# Patient Record
Sex: Male | Born: 1955 | Race: Black or African American | Hispanic: No | Smoking: Former smoker
Health system: Southern US, Community
[De-identification: ages and names within clinical notes are randomized; demographics above are authoritative.]

## PROBLEM LIST (undated history)

## (undated) DIAGNOSIS — N184 Chronic kidney disease, stage 4 (severe): Secondary | ICD-10-CM

## (undated) DIAGNOSIS — I509 Heart failure, unspecified: Secondary | ICD-10-CM

## (undated) DIAGNOSIS — I10 Essential (primary) hypertension: Secondary | ICD-10-CM

## (undated) DIAGNOSIS — E1129 Type 2 diabetes mellitus with other diabetic kidney complication: Secondary | ICD-10-CM

## (undated) DIAGNOSIS — E7800 Pure hypercholesterolemia, unspecified: Secondary | ICD-10-CM

## (undated) DIAGNOSIS — K219 Gastro-esophageal reflux disease without esophagitis: Secondary | ICD-10-CM

## (undated) HISTORY — PX: WRIST FRACTURE SURGERY: SHX121

## (undated) HISTORY — PX: FRACTURE SURGERY: SHX138

---

## 2018-04-11 DIAGNOSIS — R600 Localized edema: Secondary | ICD-10-CM

## 2018-04-11 DIAGNOSIS — I130 Hypertensive heart and chronic kidney disease with heart failure and stage 1 through stage 4 chronic kidney disease, or unspecified chronic kidney disease: Secondary | ICD-10-CM | POA: Diagnosis not present

## 2018-04-16 ENCOUNTER — Encounter (HOSPITAL_COMMUNITY): Payer: Self-pay

## 2018-04-16 ENCOUNTER — Emergency Department (HOSPITAL_COMMUNITY): Payer: Medicaid Other

## 2018-04-16 ENCOUNTER — Inpatient Hospital Stay (HOSPITAL_COMMUNITY)
Admission: EM | Admit: 2018-04-16 | Discharge: 2018-04-23 | DRG: 291 | Discharge status: Against Medical Advice | Payer: Medicaid Other | Attending: Family Medicine | Admitting: Family Medicine

## 2018-04-16 ENCOUNTER — Other Ambulatory Visit: Payer: Self-pay

## 2018-04-16 DIAGNOSIS — R778 Other specified abnormalities of plasma proteins: Secondary | ICD-10-CM | POA: Diagnosis present

## 2018-04-16 DIAGNOSIS — Z7982 Long term (current) use of aspirin: Secondary | ICD-10-CM

## 2018-04-16 DIAGNOSIS — I160 Hypertensive urgency: Secondary | ICD-10-CM | POA: Diagnosis present

## 2018-04-16 DIAGNOSIS — I5023 Acute on chronic systolic (congestive) heart failure: Secondary | ICD-10-CM | POA: Diagnosis present

## 2018-04-16 DIAGNOSIS — E785 Hyperlipidemia, unspecified: Secondary | ICD-10-CM | POA: Diagnosis present

## 2018-04-16 DIAGNOSIS — R001 Bradycardia, unspecified: Secondary | ICD-10-CM | POA: Diagnosis present

## 2018-04-16 DIAGNOSIS — I248 Other forms of acute ischemic heart disease: Secondary | ICD-10-CM | POA: Diagnosis present

## 2018-04-16 DIAGNOSIS — D696 Thrombocytopenia, unspecified: Secondary | ICD-10-CM | POA: Diagnosis present

## 2018-04-16 DIAGNOSIS — I509 Heart failure, unspecified: Secondary | ICD-10-CM

## 2018-04-16 DIAGNOSIS — R601 Generalized edema: Secondary | ICD-10-CM

## 2018-04-16 DIAGNOSIS — E8770 Fluid overload, unspecified: Secondary | ICD-10-CM | POA: Diagnosis present

## 2018-04-16 DIAGNOSIS — R402412 Glasgow coma scale score 13-15, at arrival to emergency department: Secondary | ICD-10-CM | POA: Diagnosis present

## 2018-04-16 DIAGNOSIS — Z9114 Patient's other noncompliance with medication regimen: Secondary | ICD-10-CM

## 2018-04-16 DIAGNOSIS — Z79899 Other long term (current) drug therapy: Secondary | ICD-10-CM

## 2018-04-16 DIAGNOSIS — K219 Gastro-esophageal reflux disease without esophagitis: Secondary | ICD-10-CM

## 2018-04-16 DIAGNOSIS — I5043 Acute on chronic combined systolic (congestive) and diastolic (congestive) heart failure: Secondary | ICD-10-CM | POA: Diagnosis present

## 2018-04-16 DIAGNOSIS — N184 Chronic kidney disease, stage 4 (severe): Secondary | ICD-10-CM

## 2018-04-16 DIAGNOSIS — N189 Chronic kidney disease, unspecified: Secondary | ICD-10-CM

## 2018-04-16 DIAGNOSIS — E1122 Type 2 diabetes mellitus with diabetic chronic kidney disease: Secondary | ICD-10-CM | POA: Diagnosis present

## 2018-04-16 DIAGNOSIS — D509 Iron deficiency anemia, unspecified: Secondary | ICD-10-CM | POA: Diagnosis present

## 2018-04-16 DIAGNOSIS — I10 Essential (primary) hypertension: Secondary | ICD-10-CM

## 2018-04-16 DIAGNOSIS — D631 Anemia in chronic kidney disease: Secondary | ICD-10-CM | POA: Diagnosis present

## 2018-04-16 DIAGNOSIS — T502X5A Adverse effect of carbonic-anhydrase inhibitors, benzothiadiazides and other diuretics, initial encounter: Secondary | ICD-10-CM | POA: Diagnosis present

## 2018-04-16 DIAGNOSIS — R7989 Other specified abnormal findings of blood chemistry: Secondary | ICD-10-CM

## 2018-04-16 DIAGNOSIS — Z87891 Personal history of nicotine dependence: Secondary | ICD-10-CM

## 2018-04-16 DIAGNOSIS — E7800 Pure hypercholesterolemia, unspecified: Secondary | ICD-10-CM | POA: Diagnosis present

## 2018-04-16 DIAGNOSIS — I132 Hypertensive heart and chronic kidney disease with heart failure and with stage 5 chronic kidney disease, or end stage renal disease: Principal | ICD-10-CM | POA: Diagnosis present

## 2018-04-16 DIAGNOSIS — N179 Acute kidney failure, unspecified: Secondary | ICD-10-CM | POA: Diagnosis present

## 2018-04-16 DIAGNOSIS — Z7984 Long term (current) use of oral hypoglycemic drugs: Secondary | ICD-10-CM

## 2018-04-16 DIAGNOSIS — I272 Pulmonary hypertension, unspecified: Secondary | ICD-10-CM | POA: Diagnosis present

## 2018-04-16 DIAGNOSIS — E1165 Type 2 diabetes mellitus with hyperglycemia: Secondary | ICD-10-CM | POA: Diagnosis present

## 2018-04-16 DIAGNOSIS — E876 Hypokalemia: Secondary | ICD-10-CM | POA: Diagnosis present

## 2018-04-16 DIAGNOSIS — N185 Chronic kidney disease, stage 5: Secondary | ICD-10-CM

## 2018-04-16 DIAGNOSIS — E1129 Type 2 diabetes mellitus with other diabetic kidney complication: Secondary | ICD-10-CM | POA: Diagnosis present

## 2018-04-16 DIAGNOSIS — D649 Anemia, unspecified: Secondary | ICD-10-CM

## 2018-04-16 HISTORY — DX: Type 2 diabetes mellitus with other diabetic kidney complication: E11.29

## 2018-04-16 HISTORY — DX: Gastro-esophageal reflux disease without esophagitis: K21.9

## 2018-04-16 HISTORY — DX: Chronic kidney disease, stage 4 (severe): N18.4

## 2018-04-16 HISTORY — DX: Heart failure, unspecified: I50.9

## 2018-04-16 HISTORY — DX: Pure hypercholesterolemia, unspecified: E78.00

## 2018-04-16 HISTORY — DX: Essential (primary) hypertension: I10

## 2018-04-16 LAB — CBC
HCT: 26.8 % — ABNORMAL LOW (ref 39.0–52.0)
Hemoglobin: 8.3 g/dL — ABNORMAL LOW (ref 13.0–17.0)
MCH: 24.3 pg — AB (ref 26.0–34.0)
MCHC: 31 g/dL (ref 30.0–36.0)
MCV: 78.4 fL (ref 78.0–100.0)
PLATELETS: 129 10*3/uL — AB (ref 150–400)
RBC: 3.42 MIL/uL — AB (ref 4.22–5.81)
RDW: 18.1 % — ABNORMAL HIGH (ref 11.5–15.5)
WBC: 4.2 10*3/uL (ref 4.0–10.5)

## 2018-04-16 LAB — I-STAT TROPONIN, ED: Troponin i, poc: 0.15 ng/mL (ref 0.00–0.08)

## 2018-04-16 LAB — BASIC METABOLIC PANEL
ANION GAP: 12 (ref 5–15)
BUN: 84 mg/dL — AB (ref 8–23)
CO2: 18 mmol/L — AB (ref 22–32)
CREATININE: 4.82 mg/dL — AB (ref 0.61–1.24)
Calcium: 8.6 mg/dL — ABNORMAL LOW (ref 8.9–10.3)
Chloride: 112 mmol/L — ABNORMAL HIGH (ref 98–111)
GFR calc Af Amer: 14 mL/min — ABNORMAL LOW (ref >60)
GFR calc non Af Amer: 12 mL/min — ABNORMAL LOW (ref >60)
GLUCOSE: 140 mg/dL — AB (ref 70–99)
Potassium: 4.1 mmol/L (ref 3.5–5.1)
Sodium: 142 mmol/L (ref 135–145)

## 2018-04-16 NOTE — ED Notes (Signed)
Dr. Rhunette CroftNanavati, Tori Charge RN and Caryn BeeKevin RN notified of elevated Troponin

## 2018-04-16 NOTE — ED Triage Notes (Signed)
Patient c/o leg swelling of bilateral legs and now swelling of groin. Denies CP and SOB.

## 2018-04-17 ENCOUNTER — Encounter (HOSPITAL_COMMUNITY): Payer: Self-pay | Admitting: Internal Medicine

## 2018-04-17 ENCOUNTER — Inpatient Hospital Stay (HOSPITAL_COMMUNITY): Payer: Medicaid Other

## 2018-04-17 ENCOUNTER — Other Ambulatory Visit: Payer: Self-pay

## 2018-04-17 DIAGNOSIS — E1122 Type 2 diabetes mellitus with diabetic chronic kidney disease: Secondary | ICD-10-CM | POA: Diagnosis present

## 2018-04-17 DIAGNOSIS — I132 Hypertensive heart and chronic kidney disease with heart failure and with stage 5 chronic kidney disease, or end stage renal disease: Secondary | ICD-10-CM | POA: Diagnosis present

## 2018-04-17 DIAGNOSIS — D696 Thrombocytopenia, unspecified: Secondary | ICD-10-CM | POA: Diagnosis present

## 2018-04-17 DIAGNOSIS — T502X5A Adverse effect of carbonic-anhydrase inhibitors, benzothiadiazides and other diuretics, initial encounter: Secondary | ICD-10-CM | POA: Diagnosis present

## 2018-04-17 DIAGNOSIS — D631 Anemia in chronic kidney disease: Secondary | ICD-10-CM | POA: Diagnosis present

## 2018-04-17 DIAGNOSIS — Z87891 Personal history of nicotine dependence: Secondary | ICD-10-CM | POA: Diagnosis not present

## 2018-04-17 DIAGNOSIS — E785 Hyperlipidemia, unspecified: Secondary | ICD-10-CM | POA: Diagnosis present

## 2018-04-17 DIAGNOSIS — E7800 Pure hypercholesterolemia, unspecified: Secondary | ICD-10-CM | POA: Diagnosis present

## 2018-04-17 DIAGNOSIS — I5043 Acute on chronic combined systolic (congestive) and diastolic (congestive) heart failure: Secondary | ICD-10-CM | POA: Diagnosis present

## 2018-04-17 DIAGNOSIS — N179 Acute kidney failure, unspecified: Secondary | ICD-10-CM | POA: Diagnosis present

## 2018-04-17 DIAGNOSIS — I340 Nonrheumatic mitral (valve) insufficiency: Secondary | ICD-10-CM | POA: Diagnosis not present

## 2018-04-17 DIAGNOSIS — I5023 Acute on chronic systolic (congestive) heart failure: Secondary | ICD-10-CM | POA: Diagnosis present

## 2018-04-17 DIAGNOSIS — Z79899 Other long term (current) drug therapy: Secondary | ICD-10-CM | POA: Diagnosis not present

## 2018-04-17 DIAGNOSIS — I10 Essential (primary) hypertension: Secondary | ICD-10-CM | POA: Diagnosis present

## 2018-04-17 DIAGNOSIS — E1129 Type 2 diabetes mellitus with other diabetic kidney complication: Secondary | ICD-10-CM | POA: Diagnosis present

## 2018-04-17 DIAGNOSIS — N185 Chronic kidney disease, stage 5: Secondary | ICD-10-CM | POA: Diagnosis present

## 2018-04-17 DIAGNOSIS — I272 Pulmonary hypertension, unspecified: Secondary | ICD-10-CM | POA: Diagnosis present

## 2018-04-17 DIAGNOSIS — I248 Other forms of acute ischemic heart disease: Secondary | ICD-10-CM | POA: Diagnosis present

## 2018-04-17 DIAGNOSIS — D509 Iron deficiency anemia, unspecified: Secondary | ICD-10-CM | POA: Diagnosis present

## 2018-04-17 DIAGNOSIS — I160 Hypertensive urgency: Secondary | ICD-10-CM | POA: Diagnosis present

## 2018-04-17 DIAGNOSIS — R601 Generalized edema: Secondary | ICD-10-CM | POA: Diagnosis present

## 2018-04-17 DIAGNOSIS — E876 Hypokalemia: Secondary | ICD-10-CM | POA: Diagnosis present

## 2018-04-17 DIAGNOSIS — R748 Abnormal levels of other serum enzymes: Secondary | ICD-10-CM | POA: Diagnosis not present

## 2018-04-17 DIAGNOSIS — N184 Chronic kidney disease, stage 4 (severe): Secondary | ICD-10-CM | POA: Diagnosis present

## 2018-04-17 DIAGNOSIS — R778 Other specified abnormalities of plasma proteins: Secondary | ICD-10-CM | POA: Diagnosis present

## 2018-04-17 DIAGNOSIS — Z7984 Long term (current) use of oral hypoglycemic drugs: Secondary | ICD-10-CM | POA: Diagnosis not present

## 2018-04-17 DIAGNOSIS — N189 Chronic kidney disease, unspecified: Secondary | ICD-10-CM

## 2018-04-17 DIAGNOSIS — R001 Bradycardia, unspecified: Secondary | ICD-10-CM | POA: Diagnosis present

## 2018-04-17 DIAGNOSIS — E8770 Fluid overload, unspecified: Secondary | ICD-10-CM | POA: Diagnosis present

## 2018-04-17 DIAGNOSIS — R7989 Other specified abnormal findings of blood chemistry: Secondary | ICD-10-CM | POA: Diagnosis present

## 2018-04-17 DIAGNOSIS — Z7982 Long term (current) use of aspirin: Secondary | ICD-10-CM | POA: Diagnosis not present

## 2018-04-17 DIAGNOSIS — K219 Gastro-esophageal reflux disease without esophagitis: Secondary | ICD-10-CM | POA: Diagnosis present

## 2018-04-17 DIAGNOSIS — Z9114 Patient's other noncompliance with medication regimen: Secondary | ICD-10-CM | POA: Diagnosis not present

## 2018-04-17 DIAGNOSIS — E1165 Type 2 diabetes mellitus with hyperglycemia: Secondary | ICD-10-CM | POA: Diagnosis present

## 2018-04-17 DIAGNOSIS — R402412 Glasgow coma scale score 13-15, at arrival to emergency department: Secondary | ICD-10-CM | POA: Diagnosis present

## 2018-04-17 LAB — MRSA PCR SCREENING: MRSA by PCR: NEGATIVE

## 2018-04-17 LAB — IRON AND TIBC
Iron: 38 ug/dL — ABNORMAL LOW (ref 45–182)
SATURATION RATIOS: 13 % — AB (ref 17.9–39.5)
TIBC: 298 ug/dL (ref 250–450)
UIBC: 260 ug/dL

## 2018-04-17 LAB — TROPONIN I
TROPONIN I: 0.11 ng/mL — AB (ref <0.03)
Troponin I: 0.1 ng/mL (ref <0.03)

## 2018-04-17 LAB — RETICULOCYTES
RBC.: 3.36 MIL/uL — AB (ref 4.22–5.81)
RETIC CT PCT: 0.6 % (ref 0.4–3.1)
Retic Count, Absolute: 20.2 10*3/uL (ref 19.0–186.0)

## 2018-04-17 LAB — HEMOGLOBIN A1C
HEMOGLOBIN A1C: 7.1 % — AB (ref 4.8–5.6)
MEAN PLASMA GLUCOSE: 157.07 mg/dL

## 2018-04-17 LAB — RAPID URINE DRUG SCREEN, HOSP PERFORMED
Amphetamines: NOT DETECTED
BARBITURATES: NOT DETECTED
Benzodiazepines: NOT DETECTED
COCAINE: NOT DETECTED
Opiates: NOT DETECTED
TETRAHYDROCANNABINOL: NOT DETECTED

## 2018-04-17 LAB — LIPID PANEL
CHOL/HDL RATIO: 1.9 ratio
Cholesterol: 179 mg/dL (ref 0–200)
HDL: 93 mg/dL (ref >40)
LDL Cholesterol: 81 mg/dL (ref 0–99)
TRIGLYCERIDES: 23 mg/dL (ref <150)
VLDL: 5 mg/dL (ref 0–40)

## 2018-04-17 LAB — HIV ANTIBODY (ROUTINE TESTING W REFLEX): HIV Screen 4th Generation wRfx: NONREACTIVE

## 2018-04-17 LAB — CBG MONITORING, ED
GLUCOSE-CAPILLARY: 134 mg/dL — AB (ref 70–99)
Glucose-Capillary: 137 mg/dL — ABNORMAL HIGH (ref 70–99)
Glucose-Capillary: 136 mg/dL — ABNORMAL HIGH (ref 70–99)

## 2018-04-17 LAB — ECHOCARDIOGRAM COMPLETE
HEIGHTINCHES: 71 in
WEIGHTICAEL: 3136 [oz_av]

## 2018-04-17 LAB — GLUCOSE, CAPILLARY
Glucose-Capillary: 119 mg/dL — ABNORMAL HIGH (ref 70–99)
Glucose-Capillary: 163 mg/dL — ABNORMAL HIGH (ref 70–99)

## 2018-04-17 LAB — FERRITIN: FERRITIN: 30 ng/mL (ref 24–336)

## 2018-04-17 LAB — VITAMIN B12: VITAMIN B 12: 700 pg/mL (ref 180–914)

## 2018-04-17 LAB — FOLATE: Folate: 13.4 ng/mL (ref >5.9)

## 2018-04-17 LAB — BRAIN NATRIURETIC PEPTIDE: B Natriuretic Peptide: >4500 pg/mL — ABNORMAL HIGH (ref 0.0–100.0)

## 2018-04-17 MED ORDER — FUROSEMIDE 10 MG/ML IJ SOLN
80.0000 mg | Freq: Once | INTRAMUSCULAR | Status: AC
  Administered 2018-04-17: 80 mg via INTRAVENOUS
  Filled 2018-04-17: qty 8

## 2018-04-17 MED ORDER — FUROSEMIDE 10 MG/ML IJ SOLN
40.0000 mg | INTRAMUSCULAR | Status: AC
  Administered 2018-04-17: 40 mg via INTRAVENOUS
  Filled 2018-04-17: qty 4

## 2018-04-17 MED ORDER — MORPHINE SULFATE (PF) 4 MG/ML IV SOLN: 2.0000 mg | INTRAVENOUS | Status: DC | PRN

## 2018-04-17 MED ORDER — CHLORTHALIDONE 25 MG PO TABS
25.0000 mg | ORAL_TABLET | Freq: Every day | ORAL | Status: DC
  Filled 2018-04-17: qty 1

## 2018-04-17 MED ORDER — LISINOPRIL 10 MG PO TABS
10.0000 mg | ORAL_TABLET | Freq: Every day | ORAL | Status: DC
  Administered 2018-04-17: 10 mg via ORAL
  Filled 2018-04-17: qty 1

## 2018-04-17 MED ORDER — ALBUTEROL SULFATE (2.5 MG/3ML) 0.083% IN NEBU
2.5000 mg | INHALATION_SOLUTION | RESPIRATORY_TRACT | Status: DC | PRN
Start: 2018-04-17 — End: 2018-04-23

## 2018-04-17 MED ORDER — CARVEDILOL 12.5 MG PO TABS
12.5000 mg | ORAL_TABLET | Freq: Three times a day (TID) | ORAL | Status: DC
  Administered 2018-04-17 – 2018-04-21 (×16): 12.5 mg via ORAL
  Filled 2018-04-17 (×17): qty 1

## 2018-04-17 MED ORDER — ACETAMINOPHEN 325 MG PO TABS: 650.0000 mg | ORAL_TABLET | ORAL | Status: DC | PRN

## 2018-04-17 MED ORDER — SODIUM CHLORIDE 0.9 % IV SOLN
250.0000 mL | INTRAVENOUS | Status: DC | PRN
  Administered 2018-04-18: 250 mL via INTRAVENOUS

## 2018-04-17 MED ORDER — ONDANSETRON HCL 4 MG/2ML IJ SOLN: 4.0000 mg | Freq: Four times a day (QID) | INTRAMUSCULAR | Status: DC | PRN

## 2018-04-17 MED ORDER — HYDROXYZINE HCL 10 MG PO TABS
10.0000 mg | ORAL_TABLET | Freq: Three times a day (TID) | ORAL | Status: DC | PRN
  Filled 2018-04-17: qty 1

## 2018-04-17 MED ORDER — HYDRALAZINE HCL 20 MG/ML IJ SOLN
5.0000 mg | INTRAMUSCULAR | Status: DC | PRN
  Administered 2018-04-19 – 2018-04-20 (×3): 5 mg via INTRAVENOUS
  Filled 2018-04-17 (×3): qty 1

## 2018-04-17 MED ORDER — HEPARIN SODIUM (PORCINE) 5000 UNIT/ML IJ SOLN
5000.0000 [IU] | Freq: Three times a day (TID) | INTRAMUSCULAR | Status: DC
  Administered 2018-04-17 – 2018-04-21 (×13): 5000 [IU] via SUBCUTANEOUS
  Filled 2018-04-17 (×14): qty 1

## 2018-04-17 MED ORDER — NEPRO/CARBSTEADY PO LIQD: 237.0000 mL | Freq: Two times a day (BID) | ORAL | Status: DC

## 2018-04-17 MED ORDER — SODIUM CHLORIDE 0.9% FLUSH: 3.0000 mL | INTRAVENOUS | Status: DC | PRN

## 2018-04-17 MED ORDER — INSULIN ASPART 100 UNIT/ML ~~LOC~~ SOLN
0.0000 [IU] | Freq: Three times a day (TID) | SUBCUTANEOUS | Status: DC
  Administered 2018-04-17 – 2018-04-18 (×2): 1 [IU] via SUBCUTANEOUS
  Administered 2018-04-18: 3 [IU] via SUBCUTANEOUS
  Administered 2018-04-19: 1 [IU] via SUBCUTANEOUS
  Administered 2018-04-19: 2 [IU] via SUBCUTANEOUS
  Administered 2018-04-20: 1 [IU] via SUBCUTANEOUS
  Administered 2018-04-20 – 2018-04-22 (×4): 2 [IU] via SUBCUTANEOUS
  Filled 2018-04-17: qty 1

## 2018-04-17 MED ORDER — DOXAZOSIN MESYLATE 2 MG PO TABS
2.0000 mg | ORAL_TABLET | Freq: Every day | ORAL | Status: DC
  Administered 2018-04-17 – 2018-04-22 (×7): 2 mg via ORAL
  Filled 2018-04-17 (×8): qty 1

## 2018-04-17 MED ORDER — PANTOPRAZOLE SODIUM 40 MG PO TBEC
40.0000 mg | DELAYED_RELEASE_TABLET | Freq: Every day | ORAL | Status: DC
  Administered 2018-04-17 – 2018-04-22 (×6): 40 mg via ORAL
  Filled 2018-04-17 (×7): qty 1

## 2018-04-17 MED ORDER — NITROGLYCERIN 0.4 MG SL SUBL: 0.4000 mg | SUBLINGUAL_TABLET | SUBLINGUAL | Status: DC | PRN

## 2018-04-17 MED ORDER — NEPRO/CARBSTEADY PO LIQD
237.0000 mL | Freq: Two times a day (BID) | ORAL | Status: DC
  Administered 2018-04-18 (×2): 237 mL via ORAL
  Filled 2018-04-17 (×2): qty 237

## 2018-04-17 MED ORDER — FUROSEMIDE 10 MG/ML IJ SOLN
120.0000 mg | Freq: Two times a day (BID) | INTRAVENOUS | Status: DC
  Administered 2018-04-17 (×2): 120 mg via INTRAVENOUS
  Filled 2018-04-17: qty 10
  Filled 2018-04-17 (×2): qty 12

## 2018-04-17 MED ORDER — PNEUMOCOCCAL VAC POLYVALENT 25 MCG/0.5ML IJ INJ: 0.5000 mL | INJECTION | INTRAMUSCULAR | Status: DC

## 2018-04-17 MED ORDER — HYDRALAZINE HCL 50 MG PO TABS
100.0000 mg | ORAL_TABLET | Freq: Four times a day (QID) | ORAL | Status: DC
  Administered 2018-04-17 (×2): 100 mg via ORAL
  Filled 2018-04-17: qty 4
  Filled 2018-04-17: qty 2
  Filled 2018-04-17: qty 4

## 2018-04-17 MED ORDER — SODIUM CHLORIDE 0.9% FLUSH
3.0000 mL | Freq: Two times a day (BID) | INTRAVENOUS | Status: DC
  Administered 2018-04-17 – 2018-04-22 (×9): 3 mL via INTRAVENOUS

## 2018-04-17 MED ORDER — ASPIRIN 81 MG PO CHEW
81.0000 mg | CHEWABLE_TABLET | Freq: Every day | ORAL | Status: DC
  Administered 2018-04-17 – 2018-04-22 (×6): 81 mg via ORAL
  Filled 2018-04-17 (×7): qty 1

## 2018-04-17 MED ORDER — GLIPIZIDE 10 MG PO TABS: 10.0000 mg | ORAL_TABLET | Freq: Two times a day (BID) | ORAL | Status: DC

## 2018-04-17 MED ORDER — HYDRALAZINE HCL 10 MG PO TABS
10.0000 mg | ORAL_TABLET | Freq: Four times a day (QID) | ORAL | Status: DC
  Administered 2018-04-17: 10 mg via ORAL
  Filled 2018-04-17: qty 1

## 2018-04-17 MED ORDER — INSULIN ASPART 100 UNIT/ML ~~LOC~~ SOLN: 0.0000 [IU] | Freq: Every day | SUBCUTANEOUS | Status: DC

## 2018-04-17 MED ORDER — ZOLPIDEM TARTRATE 5 MG PO TABS: 5.0000 mg | ORAL_TABLET | Freq: Every evening | ORAL | Status: DC | PRN

## 2018-04-17 NOTE — ED Notes (Signed)
Dr. Niu at bedside evaluating pt.  

## 2018-04-17 NOTE — ED Notes (Signed)
MD Dr. Jarvis NewcomerGrunz paged to notify about troponin elevated

## 2018-04-17 NOTE — Progress Notes (Signed)
  Echocardiogram 2D Echocardiogram has been performed.  Celene SkeenVijay  Ridhima Golberg 04/17/2018, 12:18 PM

## 2018-04-17 NOTE — ED Notes (Signed)
ECHO at bedside.

## 2018-04-17 NOTE — ED Provider Notes (Signed)
[facility] EMERGENCY DEPARTMENT Provider Note   CSN: [id number] Arrival date & time: 04/16/18  2134    History   Chief Complaint Chief Complaint  Patient presents with  . Groin Swelling  . Leg Swelling    HPI Brian Carey is a 62 y.o. male.  62 yo male with hx of CKD, CHF (EF 20%), HTN presents to the ED for lower extremity swelling. Patient presenting from jail, where he has received most of his care. Reports worsening swelling x 1 month, more noticeable x 2 weeks. Is most uncomfortable as his swelling has extended to his scrotum. Patient has hx of medication noncompliance, but states he has been taking his medications for the past few weeks. Reports some SOB on exertion, but has not experienced any chest pain. Still makes urine. He has had a 15 pound weight gain recently. No fevers or syncope. Was seen at [facility] ED on 04/11/18 where dialysis was discussed, but patient was not open to this treatment at the time.  The history is provided by the patient. No language interpreter was used.    Past Medical History:  Diagnosis Date  . CKD (chronic kidney disease)   . CKD (chronic kidney disease), stage IV (HCC)   . Diabetes mellitus without complication (HCC)   . Type II diabetes mellitus with renal manifestations [facility])     Patient Active Problem List   Diagnosis Date Noted  . CKD (chronic kidney disease), stage IV (HCC)   . Type II diabetes mellitus with renal manifestations (HCC)   . Essential hypertension     History reviewed. No pertinent surgical history.      Home Medications    Prior to Admission medications   Medication Sig Start Date End Date Taking? Authorizing Provider  aspirin 81 MG chewable tablet Chew 81 mg by mouth daily.   Yes [provider]  bumetanide (BUMEX) 1 MG tablet Take 2 mg by mouth 2 (two) times daily.   Yes [provider]  carvedilol (COREG) 12.5 MG tablet Take 12.5 mg by mouth 3 (three) times daily.    Yes [provider]  chlorthalidone (HYGROTON) 25 MG tablet Take 25 mg by mouth daily.   Yes [provider]  doxazosin (CARDURA) 2 MG tablet Take 2 mg by mouth at bedtime.   Yes [provider]  glipiZIDE (GLUCOTROL) 10 MG tablet Take 10 mg by mouth 2 (two) times daily.   Yes [provider]  hydrALAZINE (APRESOLINE) 50 MG tablet Take 100 mg by mouth 4 (four) times daily.   Yes [provider]  lisinopril (PRINIVIL,ZESTRIL) 10 MG tablet Take 10 mg by mouth daily.   Yes [provider]  pantoprazole (PROTONIX) 40 MG tablet Take 40 mg by mouth daily.   Yes [provider]    Family History History reviewed. No pertinent family history.  Social History Social History   Tobacco Use  . Smoking status: Former Games developer  . Smokeless tobacco: Never Used  Substance Use Topics  . Alcohol use: Not Currently  . Drug use: Not Currently     Allergies   Amlodipine and Penicillins   Review of Systems Review of Systems Ten systems reviewed and are negative for acute change, except as noted in the HPI.    Physical Exam Updated Vital Signs BP (!) 193/98   Pulse 67   Temp 99 F (37.2 C) (Oral)   Resp 14   Ht 5\' 11"  (1.803 m)   Wt  88.9 kg (196 lb)   SpO2 98%   BMI 27.34 kg/m   Physical Exam  Constitutional: He is oriented to person, place, and time. He appears well-developed and well-nourished. No distress.  Nontoxic appearing and in NAD  HENT:  Head: Normocephalic and atraumatic.  Eyes: Conjunctivae and EOM are normal. No scleral icterus.  Neck: Normal range of motion.  Cardiovascular: Normal rate, regular rhythm and intact distal pulses.  Pulmonary/Chest: Effort normal. No stridor. No respiratory distress. He has no wheezes.  Respirations even and unlabored  Abdominal: Soft. He exhibits no distension. There is no tenderness. There is no guarding.  Soft, nontender  Musculoskeletal: Normal range of motion. He exhibits  edema.  3+ pitting edema in BLE extending proximally to the waste. Scrotal edema noted.  Neurological: He is alert and oriented to person, place, and time. He exhibits normal muscle tone. Coordination normal.  GCS 15. Moving all extremities spontaneously.  Skin: Skin is warm and dry. No rash noted. He is not diaphoretic. No erythema. No pallor.  Psychiatric: He has a normal mood and affect. His behavior is normal.  Nursing note and vitals reviewed.    ED Treatments / Results  Labs (all labs ordered are listed, but only abnormal results are displayed) Labs Reviewed  BASIC METABOLIC PANEL - Abnormal; Notable for the following components:      Result Value   Chloride 112 (*)    CO2 18 (*)    Glucose, Bld 140 (*)    BUN 84 (*)    Creatinine, Ser 4.82 (*)    Calcium 8.6 (*)    GFR calc non Af Amer 12 (*)    GFR calc Af Amer 14 (*)    All other components within normal limits  CBC - Abnormal; Notable for the following components:   RBC 3.42 (*)    Hemoglobin 8.3 (*)    HCT 26.8 (*)    MCH 24.3 (*)    RDW 18.1 (*)    Platelets 129 (*)    All other components within normal limits  BRAIN NATRIURETIC PEPTIDE - Abnormal; Notable for the following components:   B Natriuretic Peptide >4,500.0 (*)    All other components within normal limits  I-STAT TROPONIN, ED - Abnormal; Notable for the following components:   Troponin i, poc 0.15 (*)    All other components within normal limits    EKG None  Radiology Dg Chest 2 View  Result Date: 04/16/2018 CLINICAL DATA:  Right-sided chest pain and dyspnea EXAM: CHEST - 2 VIEW COMPARISON:  04/11/2018 FINDINGS: Stable cardiomegaly with moderate aortic atherosclerosis. Clear lungs. No effusion or pneumothorax. No acute osseous abnormality. IMPRESSION: Cardiomegaly without active pulmonary disease. Aortic atherosclerosis without aneurysm. Electronically Signed   By: Tollie Eth M.D.   On: 04/16/2018 22:23    Procedures Procedures (including  critical care time)  Medications Ordered in ED Medications  furosemide (LASIX) injection 40 mg (40 mg Intravenous Given 04/17/18 0118)     Initial Impression / Assessment and Plan / ED Course  I have reviewed the triage vital signs and the nursing notes.  Pertinent labs & imaging results that were available during my care of the patient were reviewed by me and considered in my medical decision making (see chart for details).     62 year old male presents [facility] the emergency department [facility].  He is currently incarcerated.  Complaining of lower extremity swelling as well as scrotal edema with 15 pound weight gain.  He has anasarca on exam extending to his pelvis.  Denies any chest pain, but does report some dyspnea on exertion.  Patient with known history of chronic kidney disease.  He still makes urine.  Kidney disease may be contributing to elevated troponin level, though this may also be influenced by his degree of heart failure with fluid overload.  Clinical picture consistent with CHF exacerbation.  BNP > 4,500.  Patient given initial dose of IV lasix.  Will need further diuresis, requiring admission.  Discussed with patient that dialysis may be in his future.  He verbalizes understanding.  He was initially resistant to plans for starting dialysis, but is more open to them now.    Case discussed with Dr. Clyde LundborgNiu who will assess in the ED for admission.  Patient hemodynamically stable.   Final Clinical Impressions(s) / ED Diagnoses   Final diagnoses:  Acute congestive heart failure, unspecified heart failure type (HCC)  Chronic kidney disease, unspecified CKD stage  Anasarca  Elevated troponin  Anemia, unspecified type    ED Discharge Orders    None       Antony MaduraHumes, Rian Koon, PA-C 04/17/18 0127    Palumbo, April, MD 04/17/18 [phone]

## 2018-04-17 NOTE — H&P (Signed)
History and Physical    Brian Carey DGU:[id number] DOB: April 29, 1956 DOA: 04/16/2018  Referring MD/NP/PA:   PCP: Patient, No Pcp Per   Patient coming from:  The patient is coming from jail.  At baseline, pt is independent for most of ADL.  Chief Complaint: Shortness of breath, worsening leg edema  HPI: Brian Carey is a 62 y.o. male with medical history significant of CKD-4, CHF with EF of 25%, hypertension, hyperlipidemia, diabetes mellitus, GERD, who presents with shortness of breath, worsening leg edema.  Patient is from jail.  Patient states that he developed shortness of breath in the past several days. He has dry cough, no chest pain, fever or chills. His bilateral leg edema has worsened, up to groin area now.  He states that he has nausea and vomited twice yesterday, and had one loose stool bowel movement today.  No abdominal pain.  Patient does not have symptoms of UTI or unilateral weakness.  ED Course: pt was found to have potassium 4.1, bicarbonate 18, creatinine 4.82 (4.3 on 04/11/2018), BUN 84, BMP>4500, troponin 0.07, chest x-ray showed cardiomegaly, temperature normal, elevated blood pressure 209/97, no tachypnea, no tachycardia, oxygen saturation 98% on room air.  Patient is admitted to telemetry bed as inpatient.  Review of Systems:   General: no fevers, chills, no body weight gain, has poor appetite, has fatigue HEENT: no blurry vision, hearing changes or sore throat Respiratory: has dyspnea, coughing, no wheezing CV: no chest pain, no palpitations GI: has nausea, vomiting,  diarrhea, no constipation, abdominal pain, GU: no dysuria, burning on urination, increased urinary frequency, hematuria  Ext: has leg edema Neuro: no unilateral weakness, numbness, or tingling, no vision change or hearing loss Skin: no rash, no skin tear. MSK: No muscle spasm, no deformity, no limitation of range of movement in spin Heme: No easy bruising.  Travel history: No recent long  distant travel.  Allergy:  Allergies  Allergen Reactions  . Amlodipine Other (See Comments)    On MAR  . Penicillins Hives    Past Medical History:  Diagnosis Date  . CKD (chronic kidney disease)   . CKD (chronic kidney disease), stage IV (HCC)   . Diabetes mellitus without complication (HCC)   . Essential hypertension   . GERD (gastroesophageal reflux disease)   . Type II diabetes mellitus with renal manifestations [facility])     Past Surgical History:  Procedure Laterality Date  . right wrist fracture      Social History:  reports that he has quit smoking. He has never used smokeless tobacco. He reports that he drank alcohol. He reports that he has current or past drug history.  Family History:  Family History  Problem Relation Age of Onset  . Stroke Mother   . Brain cancer Father      Prior to Admission medications   Medication Sig Start Date End Date Taking? Authorizing Provider  aspirin 81 MG chewable tablet Chew 81 mg by mouth daily.   Yes [provider]  bumetanide (BUMEX) 1 MG tablet Take 2 mg by mouth 2 (two) times daily.   Yes [provider]  carvedilol (COREG) 12.5 MG tablet Take 12.5 mg by mouth 3 (three) times daily.   Yes [provider]  chlorthalidone (HYGROTON) 25 MG tablet Take 25 mg by mouth daily.   Yes [provider]  doxazosin (CARDURA) 2 MG tablet Take 2 mg by mouth at bedtime.   Yes [provider]  glipiZIDE (GLUCOTROL) 10 MG  tablet Take 10 mg by mouth 2 (two) times daily.   Yes [provider]  hydrALAZINE (APRESOLINE) 50 MG tablet Take 100 mg by mouth 4 (four) times daily.   Yes [provider]  lisinopril (PRINIVIL,ZESTRIL) 10 MG tablet Take 10 mg by mouth daily.   Yes [provider]  pantoprazole (PROTONIX) 40 MG tablet Take 40 mg by mouth daily.   Yes [provider]    Physical Exam: Vitals:   04/17/18 0615 04/17/18 0630 04/17/18 0645 04/17/18 0700  BP: (!)  157/70 (!) 148/68 (!) 145/73 (!) 166/71  Pulse: 64 63 64 65  Resp: 12 13 13 13   Temp:      TempSrc:      SpO2: 98% 98% 97% 98%  Weight:      Height:       General: Not in acute distress. Has anasarca HEENT:       Eyes: PERRL, EOMI, no scleral icterus.       ENT: No discharge from the ears and nose, no pharynx injection, no tonsillar enlargement.        Neck: positive JVD, no bruit, no mass felt. Heme: No neck lymph node enlargement. Cardiac: S1/S2, RRR, 2/6 systolic murmurs, No gallops or rubs. Respiratory:  No rales, wheezing, rhonchi or rubs. GI: Soft, nondistended, nontender, no rebound pain, no organomegaly, BS present. GU: No hematuria Ext: 3+ pitting leg edema bilaterally. 2+DP/PT pulse bilaterally. Musculoskeletal: No joint deformities, No joint redness or warmth, no limitation of ROM in spin. Skin: No rashes.  Neuro: Alert, oriented X3, cranial nerves II-XII grossly intact, moves all extremities normally.  Psych: Patient is not psychotic, no suicidal or hemocidal ideation.  Labs on Admission: I have personally reviewed following labs and imaging studies  CBC: Recent Labs  Lab 04/16/18 2156  WBC 4.2  HGB 8.3*  HCT 26.8*  MCV 78.4  PLT 129*   Basic Metabolic Panel: Recent Labs  Lab 04/16/18 2156  NA 142  K 4.1  CL 112*  CO2 18*  GLUCOSE 140*  BUN 84*  CREATININE 4.82*  CALCIUM 8.6*   GFR: Estimated Creatinine Clearance: 16.9 mL/min (A) (by C-G formula based on SCr of 4.82 mg/dL (H)). Liver Function Tests: No results for input(s): AST, ALT, ALKPHOS, BILITOT, PROT, ALBUMIN in the last 168 hours. No results for input(s): LIPASE, AMYLASE in the last 168 hours. No results for input(s): AMMONIA in the last 168 hours. Coagulation Profile: No results for input(s): INR, PROTIME in the last 168 hours. Cardiac Enzymes: Recent Labs  Lab 04/17/18 0538  TROPONINI 0.11*   BNP (last 3 results) No results for input(s): PROBNP in the last 8760  hours. HbA1C: Recent Labs    04/17/18 0538  HGBA1C 7.1*   CBG: No results for input(s): GLUCAP in the last 168 hours. Lipid Profile: Recent Labs    04/17/18 0538  CHOL 179  HDL 93  LDLCALC 81  TRIG 23  CHOLHDL 1.9   Thyroid Function Tests: No results for input(s): TSH, T4TOTAL, FREET4, T3FREE, THYROIDAB in the last 72 hours. Anemia Panel: No results for input(s): VITAMINB12, FOLATE, FERRITIN, TIBC, IRON, RETICCTPCT in the last 72 hours. Urine analysis: No results found for: COLORURINE, APPEARANCEUR, LABSPEC, PHURINE, GLUCOSEU, HGBUR, BILIRUBINUR, KETONESUR, PROTEINUR, UROBILINOGEN, NITRITE, LEUKOCYTESUR Sepsis Labs: @LABRCNTIP (procalcitonin:4,lacticidven:4) )No results found for this or any previous visit (from the past 240 hour(s)).   Radiological Exams on Admission: Dg Chest 2 View  Result Date: 04/16/2018 CLINICAL DATA:  Right-sided chest pain and dyspnea  EXAM: CHEST - 2 VIEW COMPARISON:  04/11/2018 FINDINGS: Stable cardiomegaly with moderate aortic atherosclerosis. Clear lungs. No effusion or pneumothorax. No acute osseous abnormality. IMPRESSION: Cardiomegaly without active pulmonary disease. Aortic atherosclerosis without aneurysm. Electronically Signed   By: Tollie Ethavid  Kwon M.D.   On: 04/16/2018 22:23     EKG: Independently reviewed.  Sinus rhythm, QTC 509, poor R wave progression, T wave inversion in inferior leads, V5-V6.   Assessment/Plan Principal Problem:   Fluid overload Active Problems:   CKD (chronic kidney disease), stage IV (HCC)   Type II diabetes mellitus with renal manifestations (HCC)   Essential hypertension   GERD (gastroesophageal reflux disease)   Acute on chronic systolic CHF (congestive heart failure) (HCC)   Anasarca   Anemia in chronic kidney disease (CKD)   Thrombocytopenia (HCC)   Elevated troponin   Hypertensive urgency   Fluid overload and acute on chronic systolic CHF: Patient's shortness of breath is likely due to fluid overload  secondary to CHF exacerbation and CKD-4. Pt has EF 25% per document from jail, not has anasarca, consistent with fluid overload and CHF exacerbation.  This is in the setting of CKD- 4  -will admit to tele bed as inpt -start IV lasix 120 mg bid (receieved 40 + 80 mg tonight) -2d echo -fluid restriction -low salt diet  CKD (chronic kidney disease), stage IV (HCC): recent Cre 4.30 on 04/11/18. His Cre 4.82 and BUN, slightly worsened -F/u by BMP -may need to nephrologist, for possiblly starting dialysis at some time point  Type II diabetes mellitus with renal manifestations [facility](HCC): Last A1c not on record. Patient is taking glipizide at home -SSI  Essential hypertension and  Hypertensive urgency: Blood pressure 209/97-->166/71. -started high dose of IV lasix -continue home Cardura, hydralazine, lisinopril, coreg -IV hydralazine as needed  GERD: -Protonix  Anemia in chronic kidney disease (CKD): Hgb 8.3<--9.4 on 04/11/18 -check anemia panel  Thrombocytopenia (HCC): [id number].  Etiology is not clear.  No bleeding tendency. -Follow-up  Elevated troponin: trop 0.17. No CP. Likely due to CKD-IV.  -trop x 3 -A1c and FLP. UDS -f/u 2d echo which is for CHF exacerbation -ASA, Lipitor   DVT ppx: SQ Heparin    Code Status: Full code Family Communication:   Yes, police officer at bedside Disposition Plan:  Anticipate discharge back to previous jail Consults called:  none Admission status:   Inpatient/tele    Date of Service 04/17/2018    Lorretta HarpXilin Zaki Gertsch [facility] Pager [phone]  If 7PM-7AM, please contact night-coverage [email or web address] Password [email or web address] 04/17/2018, 7:15 AM

## 2018-04-17 NOTE — Progress Notes (Signed)
PROGRESS NOTE  Brief Narrative: Brian Carey is a 62 y.o. male with a history of stage IV CKD, chronic combined CHF, HTN, HLD, DM who presented from jail with 1 month of progressive symmetric leg swelling and recently increased exertional dyspnea associated with nonproductive cough. Creatinine 4.82 (putative baseline 4.3) with BUN 84, BNP >4.5k, troponin 0.07 without pulmonary edema on CXR or hypoxia. BP elevated into severe range consistent with hypertensive urgency. He was started on high dose IV lasix and admitted this morning.  Subjective: States he takes medications about 1/2 the time or less. No chest pain, palpitations. Has been making a lot of urine. Didn't sleep last night, so he's tired now and not terribly interested in talking much.  Objective: BP 136/67 (BP Location: Left Arm)   Pulse (!) 58   Temp 97.8 F (36.6 C) (Oral)   Resp 12   Ht 5\' 11"  (1.803 m)   Wt 82.1 kg (181 lb)   SpO2 100%   BMI 25.24 kg/m   Gen: Well-appearing male in no distress, sleeping Pulm: Clear and nonlabored on room air  CV: RRR, II/VI systolic murmur greatest at the base, + JVD, 3+ woody edema symmetrically in lower extremities extending to upper thighs. GI: Soft, NT, ND, +BS  Neuro: Alert and oriented. No focal deficits. Skin: No rashes, lesions or ulcers  Echocardiogram 04/17/2018: - Left ventricle: The cavity size was normal. Wall thickness was   increased in a pattern of mild LVH. Systolic function was   moderately reduced. The estimated ejection fraction was in the   range of 35% to 40%. Diffuse hypokinesis. Doppler parameters are   consistent with a reversible restrictive pattern, indicative of   decreased left ventricular diastolic compliance and/or increased   left atrial pressure (grade 3 diastolic dysfunction). - Ventricular septum: Septal motion showed abnormal function and   dyssynergy. - Aortic valve: Trileaflet; moderately thickened, moderately   calcified leaflets. - Mitral  valve: There was mild regurgitation. - Left atrium: The atrium was moderately dilated. - Right ventricle: The cavity size was mildly dilated. Wall   thickness was normal. - Tricuspid valve: There was moderate regurgitation. - Pulmonary arteries: PA peak pressure: 63 mm Hg (S).  Assessment & Plan: Fluid overload due to acute on chronic combined CHF: Medication nonadherence and advancing kidney disease both contributing - Updated echocardiogram 8/6 demonstrated EF 35%-40%, mild LVH with grade 3 diastolic dysfunction and pulmonary HTN (PASP ).  - Continue lasix 120mg  IV BID and monitor I/O, weights closely. Unknown EDW. Was about 50% compliant with bumex 1mg  BID.  - Continue coreg. Hold lisinopril while diuresing aggressively with advanced CKD.   Stage IV CKD: Has seen a nephrologist who mentioned HD but states he was never told he needed it.  - No urgent indications for dialysis as he's making urine with diuretic dosing, no uremia, hyperkalemia, and moderate acidosis.   T2DM: HbA1c 7.1% - Hold home glipizide, started renal SSI  HTN with hypertensive urgency: Improving.  - Avoid overcorrection. Anticipate improvement with volume status optimization.  - Continue coreg, holding ACE, continue hydralazine. Could consider imdur. Doubt thiazide would be effective with his GFR, so will stop this.   Elevated troponin: With benign downward trend and no chest pain, most consistent with demand ischemia with HTN urgency, CKD. No focal wall motion abnormalities on echo. - Continue ASA, prn NTG. Monitor on telemetry.   Thrombocytopenia: Unknown chronicity.  - Monitor CBC  Anemia of chronic kidney disease: No report of bleeding -  Monitor hgb in AM. Possibly at baseline.   GERD:  - Continue PPI  Tyrone Nineyan B Tanicia Wolaver, MD 04/17/2018, 3:33 PM

## 2018-04-17 NOTE — Progress Notes (Addendum)
Pt refused Hydralazine- reports that it makes him feel lightheaded.  Paged MD

## 2018-04-18 ENCOUNTER — Inpatient Hospital Stay (HOSPITAL_COMMUNITY): Payer: Medicaid Other

## 2018-04-18 DIAGNOSIS — I10 Essential (primary) hypertension: Secondary | ICD-10-CM

## 2018-04-18 DIAGNOSIS — E1129 Type 2 diabetes mellitus with other diabetic kidney complication: Secondary | ICD-10-CM

## 2018-04-18 DIAGNOSIS — K219 Gastro-esophageal reflux disease without esophagitis: Secondary | ICD-10-CM

## 2018-04-18 LAB — BASIC METABOLIC PANEL
Anion gap: 14 (ref 5–15)
BUN: 90 mg/dL — AB (ref 8–23)
CHLORIDE: 109 mmol/L (ref 98–111)
CO2: 20 mmol/L — AB (ref 22–32)
Calcium: 8.6 mg/dL — ABNORMAL LOW (ref 8.9–10.3)
Creatinine, Ser: 5.11 mg/dL — ABNORMAL HIGH (ref 0.61–1.24)
GFR calc Af Amer: 13 mL/min — ABNORMAL LOW (ref >60)
GFR calc non Af Amer: 11 mL/min — ABNORMAL LOW (ref >60)
GLUCOSE: 93 mg/dL (ref 70–99)
Potassium: 3.6 mmol/L (ref 3.5–5.1)
SODIUM: 143 mmol/L (ref 135–145)

## 2018-04-18 LAB — URINALYSIS, ROUTINE W REFLEX MICROSCOPIC
Bacteria, UA: NONE SEEN
Bilirubin Urine: NEGATIVE
GLUCOSE, UA: 50 mg/dL — AB
KETONES UR: NEGATIVE mg/dL
LEUKOCYTES UA: NEGATIVE
Nitrite: NEGATIVE
PH: 5 (ref 5.0–8.0)
PROTEIN: 100 mg/dL — AB
Specific Gravity, Urine: 1.006 (ref 1.005–1.030)

## 2018-04-18 LAB — CBC
HCT: 25.1 % — ABNORMAL LOW (ref 39.0–52.0)
Hemoglobin: 7.8 g/dL — ABNORMAL LOW (ref 13.0–17.0)
MCH: 23.4 pg — AB (ref 26.0–34.0)
MCHC: 31.1 g/dL (ref 30.0–36.0)
MCV: 75.4 fL — AB (ref 78.0–100.0)
PLATELETS: 141 10*3/uL — AB (ref 150–400)
RBC: 3.33 MIL/uL — ABNORMAL LOW (ref 4.22–5.81)
RDW: 18 % — AB (ref 11.5–15.5)
WBC: 3.4 10*3/uL — ABNORMAL LOW (ref 4.0–10.5)

## 2018-04-18 LAB — GLUCOSE, CAPILLARY
Glucose-Capillary: 102 mg/dL — ABNORMAL HIGH (ref 70–99)
Glucose-Capillary: 212 mg/dL — ABNORMAL HIGH (ref 70–99)
Glucose-Capillary: 134 mg/dL — ABNORMAL HIGH (ref 70–99)
Glucose-Capillary: 142 mg/dL — ABNORMAL HIGH (ref 70–99)

## 2018-04-18 LAB — PROTEIN / CREATININE RATIO, URINE
Creatinine, Urine: 18.36 mg/dL
PROTEIN CREATININE RATIO: 4.3 mg/mg{creat} — AB (ref 0.00–0.15)
TOTAL PROTEIN, URINE: 79 mg/dL

## 2018-04-18 LAB — PHOSPHORUS: Phosphorus: 5 mg/dL — ABNORMAL HIGH (ref 2.5–4.6)

## 2018-04-18 MED ORDER — SODIUM CHLORIDE 0.9 % IV SOLN
510.0000 mg | Freq: Once | INTRAVENOUS | Status: AC
  Administered 2018-04-18: 510 mg via INTRAVENOUS
  Filled 2018-04-18: qty 17

## 2018-04-18 MED ORDER — NEPRO/CARBSTEADY PO LIQD
237.0000 mL | ORAL | Status: DC
  Administered 2018-04-19 – 2018-04-21 (×3): 237 mL via ORAL
  Filled 2018-04-18 (×5): qty 237

## 2018-04-18 MED ORDER — HYDRALAZINE HCL 50 MG PO TABS
50.0000 mg | ORAL_TABLET | Freq: Four times a day (QID) | ORAL | Status: DC
  Administered 2018-04-18: 50 mg via ORAL
  Filled 2018-04-18 (×2): qty 1

## 2018-04-18 MED ORDER — FUROSEMIDE 10 MG/ML IJ SOLN
80.0000 mg | Freq: Two times a day (BID) | INTRAMUSCULAR | Status: DC
  Administered 2018-04-18 – 2018-04-20 (×5): 80 mg via INTRAVENOUS
  Filled 2018-04-18 (×5): qty 8

## 2018-04-18 MED ORDER — HYDRALAZINE HCL 25 MG PO TABS
25.0000 mg | ORAL_TABLET | Freq: Four times a day (QID) | ORAL | Status: DC
  Administered 2018-04-18 (×2): 25 mg via ORAL
  Filled 2018-04-18 (×2): qty 1

## 2018-04-18 NOTE — Progress Notes (Signed)
PROGRESS NOTE  Brief Narrative: Brian AnchorsDavid R XXXConley is a 62 y.o. male with a history of stage IV CKD, chronic combined CHF, HTN, HLD, DM who presented from jail with 1 month of progressive symmetric leg swelling and recently increased exertional dyspnea associated with nonproductive cough. Creatinine 4.82 (putative baseline 4.3) with BUN 84, BNP >4.5k, troponin 0.07 without pulmonary edema on CXR or hypoxia. BP elevated into severe range consistent with hypertensive urgency. He was started on high dose IV lasix and admitted with significant diuresis. Creatinine has increased and nephrology consulted for recommendations. Further work up pending.  Subjective: Swelling better. No chest pain, dyspnea is improved.    Objective: BP (!) 174/75 (BP Location: Left Arm)   Pulse 66   Temp 98 F (36.7 C) (Oral)   Resp (!) 2   Ht 5\' 11"  (1.803 m)   Wt 79.4 kg (175 lb)   SpO2 100%   BMI 24.41 kg/m   Gen: 62 y.o. male in no distress Pulm: Nonlabored breathing room air. Clear. CV: Regular rate and rhythm. II/VI systolic murmur, rub, or gallop. No JVD, 2+ dependent edema. GI: Abdomen soft, non-tender, non-distended, with normoactive bowel sounds.  Ext: Warm, no deformities Skin: No rashes, lesions or ulcers on visualized skin.  Neuro: Alert and oriented. No focal neurological deficits. Psych: Judgement and insight appear fair. Mood euthymic & affect congruent. Behavior is appropriate.    Echocardiogram 04/17/2018: - Left ventricle: The cavity size was normal. Wall thickness was   increased in a pattern of mild LVH. Systolic function was   moderately reduced. The estimated ejection fraction was in the   range of 35% to 40%. Diffuse hypokinesis. Doppler parameters are   consistent with a reversible restrictive pattern, indicative of   decreased left ventricular diastolic compliance and/or increased   left atrial pressure (grade 3 diastolic dysfunction). - Ventricular septum: Septal motion showed  abnormal function and   dyssynergy. - Aortic valve: Trileaflet; moderately thickened, moderately   calcified leaflets. - Mitral valve: There was mild regurgitation. - Left atrium: The atrium was moderately dilated. - Right ventricle: The cavity size was mildly dilated. Wall   thickness was normal. - Tricuspid valve: There was moderate regurgitation. - Pulmonary arteries: PA peak pressure: 63 mm Hg (S).  Assessment & Plan: Fluid overload due to acute on chronic combined CHF: Medication nonadherence and advancing kidney disease both contributing - Updated echocardiogram 8/6 demonstrated EF 35%-40%, mild LVH with grade 3 diastolic dysfunction and pulmonary HTN (PASP 63mmHg).  - Continue lasix > with modest bump in Cr and significant diuresis, feel we can lower 120mg  > 80mg  IV BID.  - Continue to monitor I/O, weights closely (down 196 > 181 > 175lbs).  - Continue coreg. Hold lisinopril while diuresing aggressively with advanced CKD.   Stage IV CKD: Has seen a nephrologist who mentioned HD but states he was never told he needed it. Probably due to HTN.  - Allowing for good diuresis. Avoid hypotension. - Appreciate nephrology recommendations.  - Renal U/S, UA and protein pending.  T2DM: HbA1c 7.1% - Hold home glipizide, started renal SSI  HTN with hypertensive urgency: Improving.  - Avoid overcorrection, pt declined hydralazine yesterday because it made him lightheaded. Will titrate hydralazine to 50mg  dose and monitor with goal of normotension over next couple days. Anticipate improvement with volume status optimization.  - Continue coreg, holding ACE, continue hydralazine. Could consider imdur. Doubt thiazide would be effective with his GFR, so this was stopped  Elevated troponin: With  benign downward trend and no chest pain, most consistent with demand ischemia with HTN urgency, CKD. No focal wall motion abnormalities on echo. - Continue ASA, prn NTG. Monitor on telemetry.    Thrombocytopenia: Unknown chronicity.  - Monitor CBC  Anemia of chronic kidney disease and iron deficiency: No report of bleeding - Giving IV iron, will monitor hgb tmrw.  - No active bleeding reported.  GERD:  - Continue PPI  Tyrone Nine, MD 04/18/2018, 4:07 PM

## 2018-04-18 NOTE — Progress Notes (Signed)
Initial Nutrition Assessment  DOCUMENTATION CODES:   Not applicable  INTERVENTION:   -Nepro Shake po daily, each supplement provides 425 kcal and 19 grams protein  NUTRITION DIAGNOSIS:   Increased nutrient needs related to chronic illness(CHF, renal failure) as evidenced by estimated needs.  GOAL:   Patient will meet greater than or equal to 90% of their needs  MONITOR:   PO intake, Supplement acceptance, Diet advancement, Labs, Weight trends, Skin, I & O's  REASON FOR ASSESSMENT:   Malnutrition Screening Tool    ASSESSMENT:   Brian Carey is a 62 y.o. male with medical history significant of CKD-4, CHF with EF of 25%, hypertension, hyperlipidemia, diabetes mellitus, GERD, who presents with shortness of breath, worsening leg edema.  Pt admitted with fluid overload and CHF.   Spoke with pt at bedside, who reports he consumed all of his breakfast this morning. He endorses a poor appetite over an unspecified period of time ("I just haven't felt like eating"). He consumes 3 meals per day- he estimated he only consumes about 40% of meals, but he will usually bring foot items back to his room for later. Pt is unsure as to why his appetite is poor, but states "I guess it's from being in the jail for so long".   Pt reports his UBW is around 180#. He knows he has lost weight from decreased oral intake, but is unsure of amount. No wt hx available to assess changes. Pt denies any changes in activity or stamina.   Discussed importance of good meal intake to promote healing. Pt shares he has not received any nutritional supplements, but us interested in trying to optimize nutritional status.   Last Hgb A1c: 7.1 (04/17/18). PTA DM medications are 10 mg glipizide BID.   Labs reviewed: CBGS: 102-212 (inpatient orders for glycemic control are 0-5 units insulin aspart q HS and 0-9 units insulin aspart TID with meals).    NUTRITION - FOCUSED PHYSICAL EXAM:    Most Recent Value  Orbital  Region  No depletion  Upper Arm Region  No depletion  Thoracic and Lumbar Region  No depletion  Buccal Region  No depletion  Temple Region  No depletion  Clavicle Bone Region  No depletion  Clavicle and Acromion Bone Region  No depletion  Scapular Bone Region  No depletion  Dorsal Hand  No depletion  Patellar Region  No depletion  Anterior Thigh Region  No depletion  Posterior Calf Region  No depletion  Edema (RD Assessment)  Moderate  Hair  Reviewed  Eyes  Reviewed  Mouth  Reviewed  Skin  Reviewed  Nails  Reviewed       Diet Order:   Diet Order           Diet renal with fluid restriction Fluid restriction: 1200 mL Fluid; Room service appropriate? Yes; Fluid consistency: Thin  Diet effective now          EDUCATION NEEDS:   Education needs have been addressed  Skin:  Skin Assessment: Reviewed RN Assessment  Last BM:  04/17/18  Height:   Ht Readings from Last 1 Encounters:  04/17/18 5\' 11"  (1.803 m)    Weight:   Wt Readings from Last 1 Encounters:  04/18/18 175 lb (79.4 kg)    Ideal Body Weight:  78.2 kg  BMI:  Body mass index is 24.41 kg/m.  Estimated Nutritional Needs:   Kcal:  2100-2300  Protein:  95-110 grams  Fluid:  per MD goals  Brian Carey, RD, LDN, CDE Pager: [phone] After hours Pager: [phone]

## 2018-04-18 NOTE — Care Management Note (Signed)
Case Management Note  Patient Details  Name: Brian Carey MRN: [id number] Date of Birth: 1956/01/30  Subjective/Objective:   Fluid Overload               Action/Plan: [facility] independent prior to admission. Guard at bedside, [facility]; He will follow up with the Nurses/ MD at the Facility; CM will continue to follow for progression of care.  Expected Discharge Date:    possibly 04/19/2018              Expected Discharge Plan:  Corrections Facility  Discharge planning Services  CM Consult  Status of Service:  In process, will continue to follow  Reola MosherChandler, Lataysha Vohra L, RN,MHA,BSN [phone] 04/18/2018, 10:25 AM

## 2018-04-18 NOTE — Plan of Care (Signed)

## 2018-04-18 NOTE — Plan of Care (Signed)
°  Problem: Clinical Measurements: °Goal: Ability to maintain clinical measurements within normal limits will improve °Outcome: Progressing °  °Problem: Clinical Measurements: °Goal: Diagnostic test results will improve °Outcome: Progressing °  °Problem: Nutrition: °Goal: Adequate nutrition will be maintained °Outcome: Progressing °  °

## 2018-04-18 NOTE — Consult Note (Signed)
Brian Carey R XXXConley Admit Date: 04/16/2018 04/18/2018 Brian Carey, Brian Carey Requesting Physician:  Jarvis NewcomerGrunz MD  Reason for Consult:  CKD4, Hypervolemia, Edema HPI:  62 year old male with past medical history of CKD 4, chronic systolic heart failure, hypertension, diabetes, hyperlipidemia, GERD who presented yesterday with progressive dyspnea and lower extremity edema.  Patient currently is in jail.  He tells me that [place] but found the visits to be fairly uninformative.  He has very little knowledge about his current kidney disease.  Available notes indicate that his creatinine was 4.3 on 7/31 and upon presentation was 4.8, today it is 5.1.  He had edema into the proximal legs and scrotum.  He was placed on twice daily 80 mg of Lasix with 4.6 L of  urine output yesterday.  Swelling has improved.    Etiology of his renal failure is unclear.  Potentially related to his comorbidities.  No UA, renal ultrasound, UP/C review.  Home medications include bumetanide, carvedilol, chlorthalidone, lisinopril, hydralazine.  He is on a PPI.  He endorses high salt intake.  It does not appear that he takes any NSAIDs.    He has no family history of kidney disease.  2 view chest x-ray presentation with cardiomegaly, no pulmonary edema.  TTE yesterday revealed LVEF of 35 to 40% with diffuse hypokinesis and grade 3 diastolic dysfunction.  Some elevation of right-sided pressures, no severe valvular abnormalities.  Blood pressures were elevated 180s/90s at presentation and have had some improvement to 170s/70s today.  HIV screen negative.   Creatinine, Ser (mg/dL)  Date Value  91/47/829508/03/2018 5.11 (H)  04/16/2018 4.82 (H)  ] I/Os: I/O last 3 completed shifts: In: 359 [P.O.:240; IV Piggyback:119] Out: 5800 [Urine:5800]   ROS NSAIDS: No identified exposure IV Contrast no identified exposure TMP/SMX no identified exposure Hypotension not present Balance of 12 systems is negative w/  exceptions as above  PMH  Past Medical History:  Diagnosis Date  . CHF (congestive heart failure) (HCC)   . CKD (chronic kidney disease), stage IV (HCC)   . Essential hypertension   . GERD (gastroesophageal reflux disease)   . High cholesterol   . Type II diabetes mellitus with renal manifestations (HCC)    PSH  Past Surgical History:  Procedure Laterality Date  . FRACTURE SURGERY    . WRIST FRACTURE SURGERY Right ~ [id number]   FH  Family History  Problem Relation Age of Onset  . Stroke Mother   . Brain cancer Father    SH  reports that he has quit smoking. His smoking use included cigarettes. He has a 112.00 pack-year smoking history. He has never used smokeless tobacco. He reports that he drank alcohol. He reports that he has current or past drug history. Allergies  Allergies  Allergen Reactions  . Amlodipine Other (See Comments)    On MAR  . Penicillins Hives   Home medications Prior to Admission medications   Medication Sig Start Date End Date Taking? Authorizing Provider  aspirin 81 MG chewable tablet Chew 81 mg by mouth daily.   Yes [provider]  bumetanide (BUMEX) 1 MG tablet Take 2 mg by mouth 2 (two) times daily.   Yes [provider]  carvedilol (COREG) 12.5 MG tablet Take 12.5 mg by mouth 3 (three) times daily.   Yes [provider]  chlorthalidone (HYGROTON) 25 MG tablet Take 25 mg by mouth daily.   Yes [provider]  doxazosin (CARDURA) 2 MG tablet Take  2 mg by mouth at bedtime.   Yes [provider]  glipiZIDE (GLUCOTROL) 10 MG tablet Take 10 mg by mouth 2 (two) times daily.   Yes [provider]  hydrALAZINE (APRESOLINE) 50 MG tablet Take 100 mg by mouth 4 (four) times daily.   Yes [provider]  lisinopril (PRINIVIL,ZESTRIL) 10 MG tablet Take 10 mg by mouth daily.   Yes [provider]  pantoprazole (PROTONIX) 40 MG tablet Take 40 mg by mouth daily.   Yes [provider]     Current Medications Scheduled Meds: . aspirin  81 mg Oral Daily  . carvedilol  12.5 mg Oral TID  . doxazosin  2 mg Oral QHS  . feeding supplement (NEPRO CARB STEADY)  237 mL Oral BID BM  . furosemide  80 mg Intravenous BID  . heparin  5,000 Units Subcutaneous Q8H  . hydrALAZINE  25 mg Oral QID  . insulin aspart  0-5 Units Subcutaneous QHS  . insulin aspart  0-9 Units Subcutaneous TID WC  . pantoprazole  40 mg Oral Daily  . sodium chloride flush  3 mL Intravenous Q12H   Continuous Infusions: . sodium chloride     PRN Meds:.sodium chloride, acetaminophen, albuterol, hydrALAZINE, hydrOXYzine, nitroGLYCERIN, sodium chloride flush  CBC Recent Labs  Lab 04/16/18 2156 04/18/18 0503  WBC 4.2 3.4*  HGB 8.3* 7.8*  HCT 26.8* 25.1*  MCV 78.4 75.4*  PLT 129* 141*   Basic Metabolic Panel Recent Labs  Lab 04/16/18 2156 04/18/18 0503  NA 142 143  K 4.1 3.6  CL 112* 109  CO2 18* 20*  GLUCOSE 140* 93  BUN 84* 90*  CREATININE 4.82* 5.11*  CALCIUM 8.6* 8.6*    Physical Exam  Blood pressure (!) 174/75, pulse 66, temperature 98 F (36.7 C), temperature source Oral, resp. rate (!) 2, height 5\' 11"  (1.803 m), weight 79.4 kg (175 lb), SpO2 100 %. GEN: No acute distress, well-appearing, lying flat in bed on room air, breathing comfortably ENT: NCAT EYES: EOMI, anicteric CV: RRR, normal S1 and S2, no rub PULM: Clear throughout ABD: Soft, nontender SKIN: No rashes or lesions EXT: 3+ pitting edema including scrotal edema present, per patient improved   Assessment 40M presenting with significant lower extremity and scrotal edema, acute on chronic systolic heart failure exacerbation, stage V chronic kidney disease of unclear time course.  1. 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 2. Acute on chronic systolic heart failure exacerbation 3. Systemic hypervolemia, no significant pulmonary edema on chest x-ray; lungs  clear 4. Hypertension, not yet controlled, improved 5. Anemia with significant iron deficiency, iron saturation 13% and ferritin 30, hemoglobin 7.8 with microcytosis  Plan 1. UA, UP/C, Renal US, PTH, Phos 2. Cont diuretic dosing, appears effective 3. FOBT, give 510mg  IV Fereheme 4. Daily weights, Daily Renal Panel, Strict I/Os, Avoid nephrotoxins (NSAIDs, judicious IV Contrast) 5. Will continue to follow   Sabra Heck MD [phone] pgr 04/18/2018, 3:04 PM

## 2018-04-19 LAB — BASIC METABOLIC PANEL
ANION GAP: 13 (ref 5–15)
BUN: 91 mg/dL — ABNORMAL HIGH (ref 8–23)
CO2: 23 mmol/L (ref 22–32)
Calcium: 8.7 mg/dL — ABNORMAL LOW (ref 8.9–10.3)
Chloride: 106 mmol/L (ref 98–111)
Creatinine, Ser: 4.72 mg/dL — ABNORMAL HIGH (ref 0.61–1.24)
GFR calc Af Amer: 14 mL/min — ABNORMAL LOW (ref >60)
GFR calc non Af Amer: 12 mL/min — ABNORMAL LOW (ref >60)
GLUCOSE: 74 mg/dL (ref 70–99)
POTASSIUM: 3.4 mmol/L — AB (ref 3.5–5.1)
Sodium: 142 mmol/L (ref 135–145)

## 2018-04-19 LAB — CBC
HEMATOCRIT: 25.2 % — AB (ref 39.0–52.0)
HEMOGLOBIN: 7.9 g/dL — AB (ref 13.0–17.0)
MCH: 23.8 pg — AB (ref 26.0–34.0)
MCHC: 31.3 g/dL (ref 30.0–36.0)
MCV: 75.9 fL — AB (ref 78.0–100.0)
Platelets: 144 10*3/uL — ABNORMAL LOW (ref 150–400)
RBC: 3.32 MIL/uL — ABNORMAL LOW (ref 4.22–5.81)
RDW: 18.2 % — ABNORMAL HIGH (ref 11.5–15.5)
WBC: 3.5 10*3/uL — ABNORMAL LOW (ref 4.0–10.5)

## 2018-04-19 LAB — GLUCOSE, CAPILLARY
GLUCOSE-CAPILLARY: 138 mg/dL — AB (ref 70–99)
Glucose-Capillary: 85 mg/dL (ref 70–99)
Glucose-Capillary: 179 mg/dL — ABNORMAL HIGH (ref 70–99)
Glucose-Capillary: 154 mg/dL — ABNORMAL HIGH (ref 70–99)

## 2018-04-19 MED ORDER — HYDRALAZINE HCL 50 MG PO TABS
100.0000 mg | ORAL_TABLET | Freq: Four times a day (QID) | ORAL | Status: DC
Start: 2018-04-19 — End: 2018-04-19

## 2018-04-19 MED ORDER — HYDRALAZINE HCL 50 MG PO TABS
100.0000 mg | ORAL_TABLET | Freq: Three times a day (TID) | ORAL | Status: DC
  Administered 2018-04-19: 100 mg via ORAL
  Filled 2018-04-19 (×4): qty 2

## 2018-04-19 NOTE — Progress Notes (Signed)
Admit: 04/16/2018 LOS: 2  10M presenting with significant lower extremity and scrotal edema, acute on chronic systolic heart failure exacerbation, stage V chronic kidney disease of unclear time course.  Subjective:  . Continues to respond to diuretics, net -7.3 L with corresponding change in weight . Renal function remains stable at 4.72 serum creatinine, potassium 3.4, bicarbonate 23 . Received Feraheme for microcytic iron deficiency anemia . Urine protein to creatinine ratio 4.3; UA without hemoglobin or hematuria . Renal ultrasound with normal-sized kidneys without obstruction, no structural issues; findings of medical renal disease . Patient states he feels improved; is unaware of any previous proteinuria  08/07 0701 - 08/08 0700 In: 1408.3 [P.O.:1320; I.V.:0.5; IV Piggyback:87.9] Out: 2800 [Urine:2800]  Filed Weights   04/17/18 1433 04/18/18 0734 04/19/18 0530  Weight: 82.1 kg 79.4 kg 75.9 kg    Scheduled Meds: . aspirin  81 mg Oral Daily  . carvedilol  12.5 mg Oral TID  . doxazosin  2 mg Oral QHS  . feeding supplement (NEPRO CARB STEADY)  237 mL Oral Q24H  . furosemide  80 mg Intravenous BID  . heparin  5,000 Units Subcutaneous Q8H  . hydrALAZINE  100 mg Oral Q8H  . insulin aspart  0-5 Units Subcutaneous QHS  . insulin aspart  0-9 Units Subcutaneous TID WC  . pantoprazole  40 mg Oral Daily  . sodium chloride flush  3 mL Intravenous Q12H   Continuous Infusions: . sodium chloride Stopped (04/18/18 1831)   PRN Meds:.sodium chloride, acetaminophen, albuterol, hydrALAZINE, hydrOXYzine, nitroGLYCERIN, sodium chloride flush  Current Labs: reviewed  Results for Brian Carey, Brian Carey (MRN [id number]) as of 04/19/2018 11:44  Ref. Range 04/18/2018 15:54  Protein Creatinine Ratio Latest Ref Range: 0.00 - 0.15 mg/mgCre 4.30 (H)    Ref. Range 04/17/2018 05:38  Hemoglobin A1C Latest Ref Range: 4.8 - 5.6 % 7.1 (H)   Physical Exam:  Blood pressure (!) 159/70, pulse 63, temperature 98.9 F  (37.2 C), temperature source Oral, resp. rate 18, height 5\' 11"  (1.803 m), weight 75.9 kg, SpO2 98 %. GEN: No acute distress, well-appearing, lying flat in bed on room air, breathing comfortably ENT: NCAT EYES: EOMI, anicteric CV: RRR, normal S1 and S2, no rub PULM: Clear throughout ABD: Soft, nontender SKIN: No rashes or lesions EXT: improved now 1-2+ pitting edema   A 1. 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 2. Nephrotic proteinuria, new finding; contributing to hypervolemic state; unclear etiology 3. Acute on chronic systolic heart failure exacerbation 4. Systemic hypervolemia, no significant pulmonary edema on chest x-ray; lungs clear 5. Hypertension, not yet controlled, improved 6. Anemia with significant iron deficiency, iron saturation 13% and ferritin 30, hemoglobin 7.8 with microcytosis  P . Continue diuretics at current dosing . Send serological work-up for nephrotic proteinuria: Hemoglobin A1c, ANA, C3, C4, HBV surface antigen, HCV antibody, SPEP, serum free light chains; HIV screen negative already . Daily weights, Daily Renal Panel, Strict I/Os, Avoid nephrotoxins (NSAIDs, judicious IV Contrast)   Sabra Heckyan Darcus Edds MD 04/19/2018, 11:43 AM  Recent Labs  Lab 04/16/18 2156 04/18/18 0503 04/18/18 1523 04/19/18 0432  NA 142 143  --  142  K 4.1 3.6  --  3.4*  CL 112* 109  --  106  CO2 18* 20*  --  23  GLUCOSE 140* 93  --  74  BUN 84* 90*  --  91*  CREATININE 4.82* 5.11*  --  4.72*  CALCIUM 8.6* 8.6*  --  8.7*  PHOS  --   --  5.0*  --    Recent Labs  Lab 04/16/18 2156 04/18/18 0503 04/19/18 0432  WBC 4.2 3.4* 3.5*  HGB 8.3* 7.8* 7.9*  HCT 26.8* 25.1* 25.2*  MCV 78.4 75.4* 75.9*  PLT 129* 141* 144*

## 2018-04-19 NOTE — Progress Notes (Addendum)
PROGRESS NOTE  Brief Narrative: Brian AnchorsDavid R XXXConley is a 62 y.o. male with a history of stage IV CKD, chronic combined CHF, HTN, HLD, DM who presented from jail with 1 month of progressive symmetric leg swelling and recently increased exertional dyspnea associated with nonproductive cough. Creatinine 4.82 (putative baseline 4.3) with BUN 84, BNP >4.5k, troponin 0.07 without pulmonary edema on CXR or hypoxia. BP elevated into severe range consistent with hypertensive urgency. He was started on high dose IV lasix and admitted with significant diuresis. Creatinine has increased and nephrology consulted for recommendations. Further work up pending.  Subjective: Breathing back to baseline. No chest pain or palpitations. Eating ok. Swelling in legs continues gradual improvement.  Objective: BP (!) 159/70   Pulse 63   Temp 98.9 F (37.2 C) (Oral)   Resp 18   Ht 5\' 11"  (1.803 m)   Wt 75.9 kg   SpO2 98%   BMI 23.35 kg/m   Gen: 62 y.o. male in no distress Pulm: Nonlabored breathing room air. Clear. CV: Regular rate and rhythm. II/VI systolic murmur, rub, or gallop. No JVD, 1+ dependent edema. GI: Abdomen soft, non-tender, non-distended, with normoactive bowel sounds.  Ext: Warm, no deformities Skin: No rashes, lesions or ulcers on visualized skin.  Neuro: Alert and oriented. No focal neurological deficits. Psych: Judgement and insight appear fair. Mood euthymic & affect congruent. Behavior is appropriate.    Echocardiogram 04/17/2018: - Left ventricle: The cavity size was normal. Wall thickness was   increased in a pattern of mild LVH. Systolic function was   moderately reduced. The estimated ejection fraction was in the   range of 35% to 40%. Diffuse hypokinesis. Doppler parameters are   consistent with a reversible restrictive pattern, indicative of   decreased left ventricular diastolic compliance and/or increased   left atrial pressure (grade 3 diastolic dysfunction). - Ventricular septum:  Septal motion showed abnormal function and   dyssynergy. - Aortic valve: Trileaflet; moderately thickened, moderately   calcified leaflets. - Mitral valve: There was mild regurgitation. - Left atrium: The atrium was moderately dilated. - Right ventricle: The cavity size was mildly dilated. Wall   thickness was normal. - Tricuspid valve: There was moderate regurgitation. - Pulmonary arteries: PA peak pressure: 63 mm Hg (S).  Assessment & Plan: Fluid overload due to acute on chronic combined CHF: Medication nonadherence and advancing kidney disease both contributing - Updated echocardiogram 8/6 demonstrated EF 35%-40%, mild LVH with grade 3 diastolic dysfunction and pulmonary HTN (PASP 63mmHg).  - Continue lasix 80mg  IV BID, still significant diuresis with this.  - Continue to monitor I/O, weights closely (down 196 > 181 > 175 > 167lbs).  - Continue coreg. Holding lisinopril while diuresing aggressively with advanced CKD.  Stage IV CKD: Has seen a nephrologist who mentioned HD but states he was never told he needed it. Probably due to HTN. U/S with increased echogenicity, 9.9cm bilaterally with no hydro. Significant proteinuria noted.  - Stable/modestly improved with diuresis. Avoid hypotension. - Appreciate nephrology recommendations.   T2DM: HbA1c 7.1%. At inpatient goal with minimal insulin requirements.  - Hold home glipizide, started renal SSI  HTN with hypertensive urgency: Improving.  - Now able to continue toward goal of normotension, will increase hydralazine and continue other medications below.  - Continue coreg, holding ACE, continue hydralazine. Could consider imdur. Doubt thiazide would be effective with his GFR, so this was stopped  Hypokalemia: Mild, due to diuresis.  - Defer to nephrology  Elevated troponin: With benign downward  trend and no chest pain, most consistent with demand ischemia with HTN urgency, CKD. No focal wall motion abnormalities on echo. - Continue  ASA, prn NTG. Monitor on telemetry.   Thrombocytopenia: Unknown chronicity, mild.  - Monitor CBC  Anemia of chronic kidney disease and iron deficiency: No report of bleeding - Gave IV iron.  - Hgb stable  GERD:  - Continue PPI  Tyrone Nine, MD 04/19/2018, 10:03 AM

## 2018-04-20 LAB — GLUCOSE, CAPILLARY
Glucose-Capillary: 98 mg/dL (ref 70–99)
Glucose-Capillary: 150 mg/dL — ABNORMAL HIGH (ref 70–99)
Glucose-Capillary: 152 mg/dL — ABNORMAL HIGH (ref 70–99)
Glucose-Capillary: 178 mg/dL — ABNORMAL HIGH (ref 70–99)

## 2018-04-20 LAB — CBC
HCT: 25.6 % — ABNORMAL LOW (ref 39.0–52.0)
Hemoglobin: 8.1 g/dL — ABNORMAL LOW (ref 13.0–17.0)
MCH: 23.8 pg — ABNORMAL LOW (ref 26.0–34.0)
MCHC: 31.6 g/dL (ref 30.0–36.0)
MCV: 75.1 fL — AB (ref 78.0–100.0)
PLATELETS: 152 10*3/uL (ref 150–400)
RBC: 3.41 MIL/uL — ABNORMAL LOW (ref 4.22–5.81)
RDW: 18 % — AB (ref 11.5–15.5)
WBC: 3.5 10*3/uL — ABNORMAL LOW (ref 4.0–10.5)

## 2018-04-20 LAB — BASIC METABOLIC PANEL
Anion gap: 14 (ref 5–15)
BUN: 93 mg/dL — AB (ref 8–23)
CHLORIDE: 105 mmol/L (ref 98–111)
CO2: 24 mmol/L (ref 22–32)
CREATININE: 5.48 mg/dL — AB (ref 0.61–1.24)
Calcium: 8.9 mg/dL (ref 8.9–10.3)
GFR calc Af Amer: 12 mL/min — ABNORMAL LOW (ref >60)
GFR calc non Af Amer: 10 mL/min — ABNORMAL LOW (ref >60)
Glucose, Bld: 94 mg/dL (ref 70–99)
Potassium: 3.5 mmol/L (ref 3.5–5.1)
SODIUM: 143 mmol/L (ref 135–145)

## 2018-04-20 LAB — C4 COMPLEMENT: Complement C4, Body Fluid: 29 mg/dL (ref 14–44)

## 2018-04-20 LAB — HCV AB W REFLEX TO QUANT PCR

## 2018-04-20 LAB — KAPPA/LAMBDA LIGHT CHAINS
KAPPA FREE LGHT CHN: 121.7 mg/L — AB (ref 3.3–19.4)
Kappa, lambda light chain ratio: 1.35 (ref 0.26–1.65)
Lambda free light chains: 90 mg/L — ABNORMAL HIGH (ref 5.7–26.3)

## 2018-04-20 LAB — HCV INTERPRETATION

## 2018-04-20 LAB — C3 COMPLEMENT: C3 COMPLEMENT: 123 mg/dL (ref 82–167)

## 2018-04-20 LAB — ANA W/REFLEX IF POSITIVE: ANA: NEGATIVE

## 2018-04-20 LAB — HEPATITIS B SURFACE ANTIGEN: Hepatitis B Surface Ag: NEGATIVE

## 2018-04-20 MED ORDER — AMLODIPINE BESYLATE 5 MG PO TABS
5.0000 mg | ORAL_TABLET | Freq: Every day | ORAL | Status: DC
  Administered 2018-04-21 – 2018-04-22 (×2): 5 mg via ORAL
  Filled 2018-04-20 (×4): qty 1

## 2018-04-20 MED ORDER — FUROSEMIDE 80 MG PO TABS
80.0000 mg | ORAL_TABLET | Freq: Two times a day (BID) | ORAL | Status: DC
  Administered 2018-04-20 – 2018-04-21 (×2): 80 mg via ORAL
  Filled 2018-04-20 (×2): qty 1

## 2018-04-20 MED ORDER — LABETALOL HCL 5 MG/ML IV SOLN
5.0000 mg | Freq: Once | INTRAVENOUS | Status: DC
  Filled 2018-04-20: qty 4

## 2018-04-20 MED ORDER — HYDRALAZINE HCL 50 MG PO TABS
50.0000 mg | ORAL_TABLET | Freq: Three times a day (TID) | ORAL | Status: DC
  Administered 2018-04-21: 50 mg via ORAL
  Filled 2018-04-20 (×3): qty 1

## 2018-04-20 NOTE — Progress Notes (Signed)
PROGRESS NOTE  Brief Narrative: Brian Carey is a 62 y.o. male with a history of stage IV CKD, chronic combined CHF, HTN, HLD, DM who presented from jail with 1 month of progressive symmetric leg swelling and recently increased exertional dyspnea associated with nonproductive cough. Creatinine 4.82 (putative baseline 4.3) with BUN 84, BNP >4.5k, troponin 0.07 without pulmonary edema on CXR or hypoxia. BP elevated into severe range consistent with hypertensive urgency. He was started on high dose IV lasix and admitted with significant diuresis. Creatinine has increased and nephrology consulted for recommendations. Further work up pending.  Subjective: Continues to refuse hydralazine because it makes him dizzy, but was taking lower doses when ordered. Has had same problem with norvasc, though is willing to take a low dose of this. Leg swelling improved, still not quite at baseline.  Objective: BP (!) 175/76 (BP Location: Right Arm)   Pulse (!) 57   Temp 98.6 F (37 C) (Oral)   Resp 18   Ht 5\' 11"  (1.803 m)   Wt 72.6 kg   SpO2 99%   BMI 22.32 kg/m   Gen: 62 y.o. male in no distress Pulm: Nonlabored breathing room air. Clear. CV: Regular rate and rhythm. +Systolic murmur, no rub, or gallop. No JVD, 1+ dependent edema. GI: Abdomen soft, non-tender, non-distended, with normoactive bowel sounds.  Ext: Warm, no deformities Skin: No rashes, lesions or ulcers on visualized skin.  Neuro: Alert and oriented. No focal neurological deficits. Psych: Judgement and insight appear mildly impaired. Mood euthymic & affect congruent. Behavior is appropriate.    Echocardiogram 04/17/2018: - Left ventricle: The cavity size was normal. Wall thickness was   increased in a pattern of mild LVH. Systolic function was   moderately reduced. The estimated ejection fraction was in the   range of 35% to 40%. Diffuse hypokinesis. Doppler parameters are   consistent with a reversible restrictive pattern, indicative  of   decreased left ventricular diastolic compliance and/or increased   left atrial pressure (grade 3 diastolic dysfunction). - Ventricular septum: Septal motion showed abnormal function and   dyssynergy. - Aortic valve: Trileaflet; moderately thickened, moderately   calcified leaflets. - Mitral valve: There was mild regurgitation. - Left atrium: The atrium was moderately dilated. - Right ventricle: The cavity size was mildly dilated. Wall   thickness was normal. - Tricuspid valve: There was moderate regurgitation. - Pulmonary arteries: PA peak pressure: 63 mm Hg (S).  Assessment & Plan: Fluid overload due to acute on chronic combined CHF: Medication nonadherence and advancing kidney disease both contributing. Echo 8/6 demonstrated EF 35%-40%, mild LVH with grade 3 diastolic dysfunction and pulmonary HTN (PASP ).  - Continue lasix, convert to PO and monitor response. - Continue to monitor I/O, weights closely (continue significant downward trend) - Continue coreg. Holding lisinopril while diuresing aggressively with advanced CKD.  Stage IV CKD, nephrotic proteinuria: Has seen a nephrologist who mentioned HD but states he was never told he needed it. Probably due to HTN. U/S with increased echogenicity, 9.9cm bilaterally with no hydro. Significant proteinuria noted.  - Complement, ANA, Hep B and C, HIV all neg/nonreactive.  - 24 hrs protein collection started - Appreciate nephrology recommendations. Will need nephrology follow up and consideration of access placement soon. No current urgent HD needs.  HTN with hypertensive urgency: Improving.  - Continue lowq dose hydralazine as pt refuses higher doses - Start norvasc 5mg  as pt agrees to try this (has dizziness in the past with this) - Continue coreg,  holding ACE. Could consider imdur. Doubt thiazide would be effective with his GFR, so this was stopped  T2DM: HbA1c 7.1%. At inpatient goal with minimal insulin requirements.  - Hold  home glipizide, started renal SSI  Hypokalemia: Mild, due to diuresis. Resolved. - Defer to nephrology  Elevated troponin: With benign downward trend and no chest pain, most consistent with demand ischemia with HTN urgency, CKD. No focal wall motion abnormalities on echo. - Continue ASA, prn NTG. Monitor on telemetry.   Thrombocytopenia: Unknown chronicity, mild.  - Monitor CBC  Anemia of chronic kidney disease and iron deficiency: No report of bleeding - Gave IV iron.  - Hgb stable, monitor intermittently.  GERD:  - Continue PPI  Brian Nineyan B Kasiah Manka, MD 04/20/2018, 3:23 PM

## 2018-04-20 NOTE — Progress Notes (Signed)
Pt refused Hydralazine - reports dizziness after taking the medicine

## 2018-04-20 NOTE — Progress Notes (Signed)
S: No new complaints.  Refused hydralazine O:BP (!) 175/76 (BP Location: Right Arm)   Pulse (!) 57   Temp 98.6 F (37 C) (Oral)   Resp 18   Ht 5\' 11"  (1.803 m)   Wt 72.6 kg   SpO2 99%   BMI 22.32 kg/m   Intake/Output Summary (Last 24 hours) at 04/20/2018 1456 Last data filed at 04/20/2018 1338 Gross per 24 hour  Intake 520 ml  Output 5400 ml  Net -4880 ml   Intake/Output: I/O last 3 completed shifts: In: 1080 [P.O.:1080] Out: 5875 [Urine:5875]  Intake/Output this shift:  Total I/O In: 280 [P.O.:280] Out: 1475 [Urine:1475] Weight change: -6.804 kg Gen: NAD CVS: no rub Resp: cta Abd: benign Ext: 1+ edema  Recent Labs  Lab 04/16/18 2156 04/18/18 0503 04/18/18 1523 04/19/18 0432 04/20/18 0551  NA 142 143  --  142 143  K 4.1 3.6  --  3.4* 3.5  CL 112* 109  --  106 105  CO2 18* 20*  --  23 24  GLUCOSE 140* 93  --  74 94  BUN 84* 90*  --  91* 93*  CREATININE 4.82* 5.11*  --  4.72* 5.48*  CALCIUM 8.6* 8.6*  --  8.7* 8.9  PHOS  --   --  5.0*  --   --    Liver Function Tests: No results for input(s): AST, ALT, ALKPHOS, BILITOT, PROT, ALBUMIN in the last 168 hours. No results for input(s): LIPASE, AMYLASE in the last 168 hours. No results for input(s): AMMONIA in the last 168 hours. CBC: Recent Labs  Lab 04/16/18 2156 04/18/18 0503 04/19/18 0432 04/20/18 0551  WBC 4.2 3.4* 3.5* 3.5*  HGB 8.3* 7.8* 7.9* 8.1*  HCT 26.8* 25.1* 25.2* 25.6*  MCV 78.4 75.4* 75.9* 75.1*  PLT 129* 141* 144* 152   Cardiac Enzymes: Recent Labs  Lab 04/17/18 0538 04/17/18 1003  TROPONINI 0.11* 0.10*   CBG: Recent Labs  Lab 04/19/18 1112 04/19/18 1620 04/19/18 2140 04/20/18 0734 04/20/18 1139  GLUCAP 179* 138* 154* 98 150*    Iron Studies: No results for input(s): IRON, TIBC, TRANSFERRIN, FERRITIN in the last 72 hours. Studies/Results: US Renal  Result Date: 04/18/2018 CLINICAL DATA:  Chronic kidney disease.  Diabetes. EXAM: RENAL / URINARY TRACT ULTRASOUND COMPLETE  COMPARISON:  None. FINDINGS: Right Kidney: Length: 9.9 cm.  Diffusely echogenic.  No mass or hydronephrosis. Left Kidney: Length: 9.9 cm. Diffusely echogenic. 1.6 cm simple appearing upper pole cyst. No mass or hydronephrosis. Bladder: Appears normal for degree of bladder distention. Other: Enlarged prostate gland.  Bilateral pleural effusions. IMPRESSION: 1. Diffusely echogenic kidneys compatible with medical renal disease. 2. Bilateral pleural effusions. 3. Moderate prostatic hypertrophy. Electronically Signed   By: Beckie Salts M.D.   On: 04/18/2018 18:16   . aspirin  81 mg Oral Daily  . carvedilol  12.5 mg Oral TID  . doxazosin  2 mg Oral QHS  . feeding supplement (NEPRO CARB STEADY)  237 mL Oral Q24H  . furosemide  80 mg Intravenous BID  . heparin  5,000 Units Subcutaneous Q8H  . hydrALAZINE  100 mg Oral Q8H  . insulin aspart  0-5 Units Subcutaneous QHS  . insulin aspart  0-9 Units Subcutaneous TID WC  . labetalol  5 mg Intravenous Once  . pantoprazole  40 mg Oral Daily  . sodium chloride flush  3 mL Intravenous Q12H    BMET    Component Value Date/Time   NA 143 04/20/2018 0551  K 3.5 04/20/2018 0551   CL 105 04/20/2018 0551   CO2 24 04/20/2018 0551   GLUCOSE 94 04/20/2018 0551   BUN 93 (H) 04/20/2018 0551   CREATININE 5.48 (H) 04/20/2018 0551   CALCIUM 8.9 04/20/2018 0551   GFRNONAA 10 (L) 04/20/2018 0551   GFRAA 12 (L) 04/20/2018 0551   CBC    Component Value Date/Time   WBC 3.5 (L) 04/20/2018 0551   RBC 3.41 (L) 04/20/2018 0551   HGB 8.1 (L) 04/20/2018 0551   HCT 25.6 (L) 04/20/2018 0551   PLT 152 04/20/2018 0551   MCV 75.1 (L) 04/20/2018 0551   MCH 23.8 (L) 04/20/2018 0551   MCHC 31.6 04/20/2018 0551   RDW 18.0 (H) 04/20/2018 0551     Assessment/Plan:  1. AKI vs progressive CKD- no known baseline creatinine and pt denies any history of CKD, however renal US revealed echogenic kidneys.  Agree with Dr. Marisue HumbleSanford that he likely has chronic, advanced CKD from HTN  +/- GN such as FSGS.  Serologies pending.  No indication for HD at this time, as pt is without uremic symptoms and has responded well to diuresis.  Will need education on HD and vascular access placement in the near future. 2. Acute on chronic systolic CHF- diuresing well.  Will change to po lasix and follow his response.  3. Proteinuria- work up underway but need albumin and lipid panel to r/o nephrotic syndrome.  Also need to quantify urine protein 4. HTN- not well controlled and pt refused hydralazine today due to "makes me dizzy".  Cant use ace/arb due to advanced CKD.  Cant increase coreg due to bradycardia.  Will try lower dose of hydralazine and follow 5. Anemia of CKD- low iron stores, repleted but will likely require ESA 6. Disposition- pt currently incarcerated but will need to follow up with our office after discharge.   Irena CordsJoseph A. Katiria Calame, MD [facility] [phone]

## 2018-04-21 LAB — RENAL FUNCTION PANEL
ANION GAP: 11 (ref 5–15)
Albumin: 2.5 g/dL — ABNORMAL LOW (ref 3.5–5.0)
BUN: 99 mg/dL — ABNORMAL HIGH (ref 8–23)
CALCIUM: 8.6 mg/dL — AB (ref 8.9–10.3)
CO2: 26 mmol/L (ref 22–32)
Chloride: 104 mmol/L (ref 98–111)
Creatinine, Ser: 5.51 mg/dL — ABNORMAL HIGH (ref 0.61–1.24)
GFR calc Af Amer: 12 mL/min — ABNORMAL LOW (ref >60)
GFR calc non Af Amer: 10 mL/min — ABNORMAL LOW (ref >60)
Glucose, Bld: 104 mg/dL — ABNORMAL HIGH (ref 70–99)
POTASSIUM: 3.4 mmol/L — AB (ref 3.5–5.1)
Phosphorus: 4.6 mg/dL (ref 2.5–4.6)
SODIUM: 141 mmol/L (ref 135–145)

## 2018-04-21 LAB — GLUCOSE, CAPILLARY
GLUCOSE-CAPILLARY: 166 mg/dL — AB (ref 70–99)
Glucose-Capillary: 105 mg/dL — ABNORMAL HIGH (ref 70–99)
Glucose-Capillary: 178 mg/dL — ABNORMAL HIGH (ref 70–99)
Glucose-Capillary: 118 mg/dL — ABNORMAL HIGH (ref 70–99)

## 2018-04-21 LAB — PROTEIN ELECTROPHORESIS, SERUM
A/G Ratio: 0.8 (ref 0.7–1.7)
ALBUMIN ELP: 2.4 g/dL — AB (ref 2.9–4.4)
ALPHA-1-GLOBULIN: 0.3 g/dL (ref 0.0–0.4)
Alpha-2-Globulin: 0.8 g/dL (ref 0.4–1.0)
Beta Globulin: 0.8 g/dL (ref 0.7–1.3)
GLOBULIN, TOTAL: 2.9 g/dL (ref 2.2–3.9)
Gamma Globulin: 0.9 g/dL (ref 0.4–1.8)
Total Protein ELP: 5.3 g/dL — ABNORMAL LOW (ref 6.0–8.5)

## 2018-04-21 LAB — PROTEIN, URINE, 24 HOUR
Collection Interval-UPROT: 24 hours
Protein, 24H Urine: 4270 mg/d — ABNORMAL HIGH (ref 50–100)
Protein, Urine: 122 mg/dL
Urine Total Volume-UPROT: 3500 mL

## 2018-04-21 MED ORDER — FUROSEMIDE 80 MG PO TABS
80.0000 mg | ORAL_TABLET | Freq: Every day | ORAL | Status: DC
  Administered 2018-04-22: 80 mg via ORAL
  Filled 2018-04-21 (×2): qty 1

## 2018-04-21 NOTE — Progress Notes (Signed)
Patient resting comfortably during shift report. Denies complaints. Officer Jarrell at bedside .

## 2018-04-21 NOTE — Progress Notes (Signed)
S: Anxious to leave.  Able to tolerate lower dose of hydralazine O:BP (!) 160/71 (BP Location: Right Arm)   Pulse 63   Temp 98.3 F (36.8 C) (Oral)   Resp 18   Ht 5\' 11"  (1.803 m)   Wt 69.9 kg   SpO2 100%   BMI 21.49 kg/m   Intake/Output Summary (Last 24 hours) at 04/21/2018 1132 Last data filed at 04/21/2018 1110 Gross per 24 hour  Intake 521.17 ml  Output 3525 ml  Net -3003.83 ml   Intake/Output: I/O last 3 completed shifts: In: 520 [P.O.:520] Out: 6975 [Urine:6975]  Intake/Output this shift:  Total I/O In: 241.2 [P.O.:240; I.V.:1.2] Out: 325 [Urine:325] Weight change: -2.676 kg Gen: NAD CVS: no rub Resp: cta Abd: benign Ext: trace edema  Recent Labs  Lab 04/16/18 2156 04/18/18 0503 04/18/18 1523 04/19/18 0432 04/20/18 0551 04/21/18 0420  NA 142 143  --  142 143 141  K 4.1 3.6  --  3.4* 3.5 3.4*  CL 112* 109  --  106 105 104  CO2 18* 20*  --  23 24 26   GLUCOSE 140* 93  --  74 94 104*  BUN 84* 90*  --  91* 93* 99*  CREATININE 4.82* 5.11*  --  4.72* 5.48* 5.51*  ALBUMIN  --   --   --   --   --  2.5*  CALCIUM 8.6* 8.6*  --  8.7* 8.9 8.6*  PHOS  --   --  5.0*  --   --  4.6   Liver Function Tests: Recent Labs  Lab 04/21/18 0420  ALBUMIN 2.5*   No results for input(s): LIPASE, AMYLASE in the last 168 hours. No results for input(s): AMMONIA in the last 168 hours. CBC: Recent Labs  Lab 04/16/18 2156 04/18/18 0503 04/19/18 0432 04/20/18 0551  WBC 4.2 3.4* 3.5* 3.5*  HGB 8.3* 7.8* 7.9* 8.1*  HCT 26.8* 25.1* 25.2* 25.6*  MCV 78.4 75.4* 75.9* 75.1*  PLT 129* 141* 144* 152   Cardiac Enzymes: Recent Labs  Lab 04/17/18 0538 04/17/18 1003  TROPONINI 0.11* 0.10*   CBG: Recent Labs  Lab 04/20/18 0734 04/20/18 1139 04/20/18 1552 04/20/18 2130 04/21/18 0735  GLUCAP 98 150* 152* 178* 105*    Iron Studies: No results for input(s): IRON, TIBC, TRANSFERRIN, FERRITIN in the last 72 hours. Studies/Results: No results found. Marland Kitchen amLODipine  5 mg Oral  Daily  . aspirin  81 mg Oral Daily  . carvedilol  12.5 mg Oral TID  . doxazosin  2 mg Oral QHS  . feeding supplement (NEPRO CARB STEADY)  237 mL Oral Q24H  . furosemide  80 mg Oral BID  . heparin  5,000 Units Subcutaneous Q8H  . hydrALAZINE  50 mg Oral Q8H  . insulin aspart  0-5 Units Subcutaneous QHS  . insulin aspart  0-9 Units Subcutaneous TID WC  . labetalol  5 mg Intravenous Once  . pantoprazole  40 mg Oral Daily  . sodium chloride flush  3 mL Intravenous Q12H    BMET    Component Value Date/Time   NA 141 04/21/2018 0420   K 3.4 (L) 04/21/2018 0420   CL 104 04/21/2018 0420   CO2 26 04/21/2018 0420   GLUCOSE 104 (H) 04/21/2018 0420   BUN 99 (H) 04/21/2018 0420   CREATININE 5.51 (H) 04/21/2018 0420   CALCIUM 8.6 (L) 04/21/2018 0420   GFRNONAA 10 (L) 04/21/2018 0420   GFRAA 12 (L) 04/21/2018 0420   CBC  Component Value Date/Time   WBC 3.5 (L) 04/20/2018 0551   RBC 3.41 (L) 04/20/2018 0551   HGB 8.1 (L) 04/20/2018 0551   HCT 25.6 (L) 04/20/2018 0551   PLT 152 04/20/2018 0551   MCV 75.1 (L) 04/20/2018 0551   MCH 23.8 (L) 04/20/2018 0551   MCHC 31.6 04/20/2018 0551   RDW 18.0 (H) 04/20/2018 0551     Assessment/Plan:  1. AKI vs progressive CKD- no known baseline creatinine and pt denies any history of CKD, however renal US revealed echogenic kidneys.  Agree with Dr. Marisue HumbleSanford that he likely has chronic, advanced CKD from HTN +/- GN such as FSGS.  Serologies pending (negative ANA, HCV, HBV, HIV, normal complements).  No indication for HD at this time, as pt is without uremic symptoms and has responded well to diuresis.  Will need education on HD and vascular access placement in the near future.  Will also decrease diuretics given brisk diuresis overnight.  Switched to po lasix and hopefully Cr will stabilize so he can be discharged and f/u with Dr. Marisue HumbleSanford as an outpatient. 2. Acute on chronic systolic CHF- diuresing well.  Will change to po lasix and follow his response.   3. Proteinuria- work up underway but need albumin and lipid panel to r/o nephrotic syndrome.  Also need to quantify urine protein 4. HTN- not well controlled and pt refused hydralazine today due to "makes me dizzy".  Cant use ace/arb due to advanced CKD.  Cant increase coreg due to bradycardia.  Will try lower dose of hydralazine and follow 5. Anemia of CKD- low iron stores, repleted but will likely require ESA 6. Disposition- pt currently incarcerated but will need to follow up with our office after discharge.  Waiting on stabilization of BUN/Cr prior to discharge.  Irena CordsJoseph A. Tison Leibold, MD [facility] [phone]

## 2018-04-21 NOTE — Progress Notes (Signed)
PROGRESS NOTE  Brief Narrative: Brian Carey is a 62 y.o. male with a history of stage IV CKD, chronic combined CHF, HTN, HLD, DM who presented from jail with 1 month of progressive symmetric leg swelling and recently increased exertional dyspnea associated with nonproductive cough. Creatinine 4.82 (putative baseline 4.3) with BUN 84, BNP >4.5k, troponin 0.07 without pulmonary edema on CXR or hypoxia. BP elevated into severe range consistent with hypertensive urgency. He was started on high dose IV lasix and admitted with significant diuresis. Creatinine has increased and nephrology consulted for recommendations. Further work up has been largely negative. Transitioned to po diuretic 8/9 with continued brisk diuresis and slight creatinine bump.  Subjective: Refusing BP medications. Swelling has essentially resolved. No dyspnea or chest pain. Wants to leave.  Objective: BP (!) 160/71 (BP Location: Right Arm)   Pulse 63   Temp 98.3 F (36.8 C) (Oral)   Resp 18   Ht 5\' 11"  (1.803 m)   Wt 69.9 kg   SpO2 100%   BMI 21.49 kg/m   Gen: 61 y.o. male in no distress Pulm: Nonlabored breathing room air. Clear. CV: Regular rate and rhythm. No murmur, rub, or gallop. No JVD, trace pitting dependent edema. GI: Abdomen soft, non-tender, non-distended, with normoactive bowel sounds.  Ext: Warm, no deformities Skin: No rashes, lesions or ulcers on visualized skin.  Neuro: Alert and oriented. No focal neurological deficits. Psych: Judgement and insight appear fair. Mood euthymic & affect congruent. Behavior is appropriate.    Echocardiogram 04/17/2018: - Left ventricle: The cavity size was normal. Wall thickness was   increased in a pattern of mild LVH. Systolic function was   moderately reduced. The estimated ejection fraction was in the   range of 35% to 40%. Diffuse hypokinesis. Doppler parameters are   consistent with a reversible restrictive pattern, indicative of   decreased left ventricular  diastolic compliance and/or increased   left atrial pressure (grade 3 diastolic dysfunction). - Ventricular septum: Septal motion showed abnormal function and   dyssynergy. - Aortic valve: Trileaflet; moderately thickened, moderately   calcified leaflets. - Mitral valve: There was mild regurgitation. - Left atrium: The atrium was moderately dilated. - Right ventricle: The cavity size was mildly dilated. Wall   thickness was normal. - Tricuspid valve: There was moderate regurgitation. - Pulmonary arteries: PA peak pressure: 63 mm Hg (S).  Assessment & Plan: Fluid overload due to acute on chronic combined CHF: Medication nonadherence and advancing kidney disease both contributing. Echo 8/6 demonstrated EF 35%-40%, mild LVH with grade 3 diastolic dysfunction and pulmonary HTN (PASP ).  - Transitioned to po lasix yesterday, still with significant diuresis and creatinine up along with BUN. D/w nephrology. Need to evaluate metabolic panel with ongoing po diuretic. Will actually decrease frequency.  - Continue to monitor I/O, weights closely (continue significant downward trend) - Continue coreg. Holding lisinopril while diuresing aggressively with advanced CKD.  Stage IV CKD, nephrotic proteinuria: Has seen a nephrologist who mentioned HD but states he was never told he needed it. Probably due to HTN. U/S with increased echogenicity, 9.9cm bilaterally with no hydro. Significant proteinuria noted.  - Complement, ANA, Hep B and C, HIV all neg/nonreactive.  - 24 hrs protein collection started, pending - Appreciate nephrology recommendations. Will need nephrology follow up and consideration of access placement soon. Fortunately no current urgent HD needs.  HTN with hypertensive urgency: Improving.  - Continue antihypertensives as ordered which would likely bring him to goal without overcorrection. He does  continue to refuse medications despite counseling from RN, pharmacy, and provider. -  Continue coreg, holding ACE. Stopped thiazide  T2DM: HbA1c 7.1%. At inpatient goal with minimal insulin requirements.  - Hold home glipizide. Continue SSI with intermittent hyperglycemia.  Hypokalemia: Mild, due to diuresis.. - Defer Marland Kitchento nephrology  Elevated troponin: With benign downward trend and no chest pain, most consistent with demand ischemia with HTN urgency, CKD. No focal wall motion abnormalities on echo. - Continue ASA, prn NTG. Monitor on telemetry.   Thrombocytopenia: Unknown chronicity, mild.  - Monitor CBC  Anemia of chronic kidney disease and iron deficiency: No report of bleeding - Gave IV iron.  - Hgb stable, monitor intermittently.  GERD:  - Continue PPI  Tyrone Nineyan B Grunz, MD 04/21/2018, 3:11 PM

## 2018-04-22 LAB — GLUCOSE, CAPILLARY
GLUCOSE-CAPILLARY: 197 mg/dL — AB (ref 70–99)
Glucose-Capillary: 192 mg/dL — ABNORMAL HIGH (ref 70–99)

## 2018-04-22 LAB — RENAL FUNCTION PANEL
ANION GAP: 15 (ref 5–15)
Albumin: 2.6 g/dL — ABNORMAL LOW (ref 3.5–5.0)
BUN: 100 mg/dL — ABNORMAL HIGH (ref 8–23)
CALCIUM: 8.6 mg/dL — AB (ref 8.9–10.3)
CHLORIDE: 103 mmol/L (ref 98–111)
CO2: 23 mmol/L (ref 22–32)
Creatinine, Ser: 5.82 mg/dL — ABNORMAL HIGH (ref 0.61–1.24)
GFR calc non Af Amer: 9 mL/min — ABNORMAL LOW (ref >60)
GFR, EST AFRICAN AMERICAN: 11 mL/min — AB (ref >60)
Glucose, Bld: 97 mg/dL (ref 70–99)
Phosphorus: 5.7 mg/dL — ABNORMAL HIGH (ref 2.5–4.6)
Potassium: 3.7 mmol/L (ref 3.5–5.1)
Sodium: 141 mmol/L (ref 135–145)

## 2018-04-22 LAB — CBC
HEMATOCRIT: 28.3 % — AB (ref 39.0–52.0)
HEMOGLOBIN: 8.7 g/dL — AB (ref 13.0–17.0)
MCH: 23.5 pg — AB (ref 26.0–34.0)
MCHC: 30.7 g/dL (ref 30.0–36.0)
MCV: 76.5 fL — ABNORMAL LOW (ref 78.0–100.0)
Platelets: 192 10*3/uL (ref 150–400)
RBC: 3.7 MIL/uL — AB (ref 4.22–5.81)
RDW: 17.9 % — ABNORMAL HIGH (ref 11.5–15.5)
WBC: 4 10*3/uL (ref 4.0–10.5)

## 2018-04-22 NOTE — Progress Notes (Addendum)
Patient resting comfortably during shift report. Denies complaints. Pt does reiterate refusal of heparin and hydralazine.  [place] [employer] [place] at bedside. Pt is not restrained/shackled/cuffed to bed.

## 2018-04-22 NOTE — Progress Notes (Signed)
Patient's CBG was not obtained this am, however a venous specimen obtained minutes before 0700 revealed a blood sugar of 97. Pt has not required morning coverage for the last few days; today seems in line with this pattern.

## 2018-04-22 NOTE — Progress Notes (Signed)
S: Pt is angry and wants to be discharged.  "I don't want your help and I don't want dialysis" O:BP (!) 187/89   Pulse (!) 57   Temp 99 F (37.2 C) (Oral)   Resp 18   Ht 5\' 11"  (1.803 m)   Wt 68.2 kg Comment: scale b  SpO2 95%   BMI 20.98 kg/m   Intake/Output Summary (Last 24 hours) at 04/22/2018 1122 Last data filed at 04/22/2018 1048 Gross per 24 hour  Intake 744 ml  Output 2500 ml  Net -1756 ml   Intake/Output: I/O last 3 completed shifts: In: 745.2 [P.O.:744; I.V.:1.2] Out: 4050 [Urine:4050]  Intake/Output this shift:  Total I/O In: 240 [P.O.:240] Out: 475 [Urine:475] Weight change: -1.679 kg Gen:NAD CVS: no rub, bradycardic Resp: cta Abd: benign Ext: no edema  Recent Labs  Lab 04/16/18 2156 04/18/18 0503 04/18/18 1523 04/19/18 0432 04/20/18 0551 04/21/18 0420 04/22/18 0511  NA 142 143  --  142 143 141 141  K 4.1 3.6  --  3.4* 3.5 3.4* 3.7  CL 112* 109  --  106 105 104 103  CO2 18* 20*  --  23 24 26 23   GLUCOSE 140* 93  --  74 94 104* 97  BUN 84* 90*  --  91* 93* 99* 100*  CREATININE 4.82* 5.11*  --  4.72* 5.48* 5.51* 5.82*  ALBUMIN  --   --   --   --   --  2.5* 2.6*  CALCIUM 8.6* 8.6*  --  8.7* 8.9 8.6* 8.6*  PHOS  --   --  5.0*  --   --  4.6 5.7*   Liver Function Tests: Recent Labs  Lab 04/21/18 0420 04/22/18 0511  ALBUMIN 2.5* 2.6*   No results for input(s): LIPASE, AMYLASE in the last 168 hours. No results for input(s): AMMONIA in the last 168 hours. CBC: Recent Labs  Lab 04/16/18 2156 04/18/18 0503 04/19/18 0432 04/20/18 0551 04/22/18 0511  WBC 4.2 3.4* 3.5* 3.5* 4.0  HGB 8.3* 7.8* 7.9* 8.1* 8.7*  HCT 26.8* 25.1* 25.2* 25.6* 28.3*  MCV 78.4 75.4* 75.9* 75.1* 76.5*  PLT 129* 141* 144* 152 192   Cardiac Enzymes: Recent Labs  Lab 04/17/18 0538 04/17/18 1003  TROPONINI 0.11* 0.10*   CBG: Recent Labs  Lab 04/20/18 2130 04/21/18 0735 04/21/18 1205 04/21/18 1613 04/21/18 2125  GLUCAP 178* 105* 178* 166* 118*    Iron  Studies: No results for input(s): IRON, TIBC, TRANSFERRIN, FERRITIN in the last 72 hours. Studies/Results: No results found. Marland Kitchen. amLODipine  5 mg Oral Daily  . aspirin  81 mg Oral Daily  . carvedilol  12.5 mg Oral TID  . doxazosin  2 mg Oral QHS  . feeding supplement (NEPRO CARB STEADY)  237 mL Oral Q24H  . furosemide  80 mg Oral Daily  . heparin  5,000 Units Subcutaneous Q8H  . hydrALAZINE  50 mg Oral Q8H  . insulin aspart  0-5 Units Subcutaneous QHS  . insulin aspart  0-9 Units Subcutaneous TID WC  . labetalol  5 mg Intravenous Once  . pantoprazole  40 mg Oral Daily  . sodium chloride flush  3 mL Intravenous Q12H    BMET    Component Value Date/Time   NA 141 04/22/2018 0511   K 3.7 04/22/2018 0511   CL 103 04/22/2018 0511   CO2 23 04/22/2018 0511   GLUCOSE 97 04/22/2018 0511   BUN 100 (H) 04/22/2018 0511   CREATININE 5.82 (H) 04/22/2018  1610   CALCIUM 8.6 (L) 04/22/2018 0511   GFRNONAA 9 (L) 04/22/2018 0511   GFRAA 11 (L) 04/22/2018 0511   CBC    Component Value Date/Time   WBC 4.0 04/22/2018 0511   RBC 3.70 (L) 04/22/2018 0511   HGB 8.7 (L) 04/22/2018 0511   HCT 28.3 (L) 04/22/2018 0511   PLT 192 04/22/2018 0511   MCV 76.5 (L) 04/22/2018 0511   MCH 23.5 (L) 04/22/2018 0511   MCHC 30.7 04/22/2018 0511   RDW 17.9 (H) 04/22/2018 0511     Assessment/Plan:  1. AKIvs progressive CKD- no known baseline creatinine and pt denies any history of CKD, however renal US revealed echogenic kidneys. Agree with Dr. Marisue Humble that he likely has chronic, advanced CKD from HTN +/- GN such as FSGS. Serologies pending (negative ANA, HCV, HBV, HIV, normal complements). No indication for HD at this time, as pt is without uremic symptoms and has responded well to diuresis. Will need education on HD and vascular access placement in the near future.  Will also decrease diuretics given brisk diuresis overnight.  Switched to po lasix and decreased frequency to once daily due to vigorous  diuresis.  Unfortunately his Cr has not stabilized and given his disposition (jail) I don't feel comfortable allowing him to be discharged.  Pt wants to leave and is not interested in HD but has also not learned anything about it.  He can f/u with Dr. Marisue Humble as an outpatient but will need ongoing education about the severity and chronicity of his kidney disease. 2. Acute on chronic systolic CHF- diuresing well. excellent response to po lasix.  Decreased to once daily.  3. Proteinuria- work up underway but need albumin and lipid panel to r/o nephrotic syndrome. Also need to quantify urine protein (24 hour urine collected) 4. HTN- not well controlled and pt refused hydralazine due to "makes me dizzy". Noncompliance is likely chronic and would explain his CKD.  Cant use ace/arb due to advanced CKD. Cant increase coreg due to bradycardia. Will try lower dose of hydralazine and follow 5. Anemia of CKD- low iron stores, repleted but will likely require ESA 6. Disposition- pt currently incarcerated but will need to follow up with our office after discharge. Waiting on stabilization of BUN/Cr prior to discharge.   Irena Cords, MD [employer] [phone]

## 2018-04-22 NOTE — Progress Notes (Signed)
PROGRESS NOTE  Brief Narrative: Brian Carey is a 62 y.o. male with a history of stage IV CKD, chronic combined CHF, HTN, HLD, DM who presented from jail with 1 month of progressive symmetric leg swelling and recently increased exertional dyspnea associated with nonproductive cough. Creatinine 4.82 (putative baseline 4.3) with BUN 84, BNP >4.5k, troponin 0.07 without pulmonary edema on CXR or hypoxia. BP elevated into severe range consistent with hypertensive urgency. He was started on high dose IV lasix and admitted with significant diuresis. Creatinine has increased and nephrology consulted for recommendations. Further work up has been largely negative. Transitioned to po diuretic 8/9 with continued brisk diuresis and slight creatinine bump which has continued.  Subjective: Swelling resolved, no dyspnea. Irritated about being in the hospital. Thinks we're keeping him here because we will make more money that way.  Objective: BP (!) 193/87 (BP Location: Left Arm)   Pulse 60   Temp 98.1 F (36.7 C) (Oral)   Resp 18   Ht 5\' 11"  (1.803 m)   Wt 68.2 kg Comment: scale b  SpO2 100%   BMI 20.98 kg/m   Gen: 62 y.o. male in no distress Pulm: Nonlabored breathing room air. Clear. CV: Regular rate and rhythm. No murmur, rub, or gallop. No JVD, no dependent edema. GI: Abdomen soft, non-tender, non-distended, with normoactive bowel sounds.  Ext: Warm, no deformities Skin: No rashes, lesions or ulcers on visualized skin.  Neuro: Alert and oriented. No focal neurological deficits. Psych: Judgement and insight appear impaired. Mood frustrated & affect is coarse. Behavior is juvenile.  Echocardiogram 04/17/2018: - Left ventricle: The cavity size was normal. Wall thickness was   increased in a pattern of mild LVH. Systolic function was   moderately reduced. The estimated ejection fraction was in the   range of 35% to 40%. Diffuse hypokinesis. Doppler parameters are   consistent with a reversible  restrictive pattern, indicative of   decreased left ventricular diastolic compliance and/or increased   left atrial pressure (grade 3 diastolic dysfunction). - Ventricular septum: Septal motion showed abnormal function and   dyssynergy. - Aortic valve: Trileaflet; moderately thickened, moderately   calcified leaflets. - Mitral valve: There was mild regurgitation. - Left atrium: The atrium was moderately dilated. - Right ventricle: The cavity size was mildly dilated. Wall   thickness was normal. - Tricuspid valve: There was moderate regurgitation. - Pulmonary arteries: PA peak pressure: 63 mm Hg (S).  Assessment & Plan: Fluid overload due to acute on chronic combined CHF: Medication nonadherence and advancing kidney disease both contributing. Echo 8/6 demonstrated EF 35%-40%, mild LVH with grade 3 diastolic dysfunction and pulmonary HTN (PASP 63mmHg).  - Transitioned to po lasix with still increasing BUN/Cr, decreasing frequency of dosing. Continues to have net negative balance, down overall from 195lbs to 150lbs today.  - Discussed at length with the patient that it is unsafe for him to be discharged today, hopeful for discharge tomorrow. - Continue to monitor I/O, weights - Holding lisinopril while diuresing aggressively with advanced CKD.  Stage IV CKD, nephrotic proteinuria: Has seen a nephrologist who mentioned HD but states he was never told he needed it. Probably due to HTN. U/S with increased echogenicity, 9.9cm bilaterally with no hydro. 24 hrs protein was 4.27g/d. Complement, ANA, Hep B and C, HIV all neg/nonreactive.  - Appreciate nephrology recommendations. Will need nephrology follow up and consideration of access placement soon. Fortunately no current urgent HD needs.  HTN with hypertensive urgency: Improving.  - Continue antihypertensives  as ordered which would likely bring him to goal without overcorrection. He continues to refuse medications despite counseling from RN,  pharmacy, and provider. - Hold coreg for bradycardia, holding ACE. Stopped thiazide.  Sinus bradycardia:  - Hold beta blocker  T2DM: HbA1c 7.1%. At inpatient goal with minimal insulin requirements.  - Hold home glipizide. Continue SSI with intermittent hyperglycemia.  Hypokalemia: Mild, due to diuresis.Marland Kitchen - Defer to nephrology  Elevated troponin: With benign downward trend and no chest pain, most consistent with demand ischemia with HTN urgency, CKD. No focal wall motion abnormalities on echo. - Continue ASA, prn NTG. Monitor on telemetry.   Thrombocytopenia: Unknown chronicity, mild.  - Monitor CBC  Anemia of chronic kidney disease and iron deficiency: No report of bleeding - Gave IV iron.  - Hgb stable, monitor intermittently.  GERD:  - Continue PPI  Tyrone Nine, MD 04/22/2018, 2:52 PM

## 2018-04-22 NOTE — Progress Notes (Signed)
Page to Dr Jarvis NewcomerGrunz  3e24 Yetta FlockXXXConley, brady this am. coreg paused until confirmation from MD on no dose change/to give.

## 2018-04-23 LAB — GLUCOSE, CAPILLARY: Glucose-Capillary: 110 mg/dL — ABNORMAL HIGH (ref 70–99)

## 2018-04-23 MED ORDER — FUROSEMIDE 80 MG PO TABS: 80.0000 mg | ORAL_TABLET | Freq: Every day | ORAL | 0 refills | Status: AC

## 2018-04-23 MED ORDER — CALCIUM ACETATE (PHOS BINDER) 667 MG PO CAPS: 667.0000 mg | ORAL_CAPSULE | Freq: Three times a day (TID) | ORAL | Status: DC

## 2018-04-23 MED ORDER — CARVEDILOL 6.25 MG PO TABS: 6.2500 mg | ORAL_TABLET | Freq: Two times a day (BID) | ORAL | 0 refills | Status: AC

## 2018-04-23 NOTE — Progress Notes (Signed)
Pt states he does not want any medications or labs. Pt states, "You tell the doctor that!" Pt refuses to let RN check his BP. MD notified.

## 2018-04-23 NOTE — Progress Notes (Addendum)
MD states pt is leaving AMA and needs to sign an AMA form. MD states he wants pt to receive his prescriptions too. RN notified facility. Misty StanleyLisa from [facility] states that pt needs to be assessed by [facility] before he can return to their facility. RN called UR board [phone]). UR Board states the MD needs to fax them pt's most updated clinicals in order for them to assess pt. Fax number is [phone]. MD notified.

## 2018-04-23 NOTE — Progress Notes (Signed)
RN called report to George HughJanice Caldwell, RN. Liborio NixonJanice states pt can return at any time.

## 2018-04-23 NOTE — Progress Notes (Signed)
Pt discharged with Brian LevelJuan Carey, [place]. Pt is not in distress. Pt refused wheelchair. Pt belongings with pt.

## 2018-04-23 NOTE — Progress Notes (Signed)
Subjective: Interval History: has complaints wants to get out of here.  Objective: Vital signs in last 24 hours: Temp:  [98.1 F (36.7 C)-98.5 F (36.9 C)] 98.5 F (36.9 C) (08/12 0425) Pulse Rate:  [57-63] 58 (08/12 0425) Resp:  [18] 18 (08/12 0425) BP: (177-193)/(81-89) 177/85 (08/12 0425) SpO2:  [97 %-100 %] 97 % (08/12 0425) Weight:  [66.8 kg] 66.8 kg (08/12 0425) Weight change: -1.406 kg  Intake/Output from previous day: 08/11 0701 - 08/12 0700 In: 960 [P.O.:960] Out: 1800 [Urine:1800] Intake/Output this shift: Total I/O In: -  Out: 400 [Urine:400]  General appearance: alert, cooperative and no distress Resp: clear to auscultation bilaterally Cardio: S1, S2 normal, S4 present and systolic murmur: holosystolic 2/6, blowing at apex GI: soft, non-tender; bowel sounds normal; no masses,  no organomegaly Extremities: extremities normal, atraumatic, no cyanosis or edema  Lab Results: Recent Labs    04/22/18 0511  WBC 4.0  HGB 8.7*  HCT 28.3*  PLT 192   BMET:  Recent Labs    04/21/18 0420 04/22/18 0511  NA 141 141  K 3.4* 3.7  CL 104 103  CO2 26 23  GLUCOSE 104* 97  BUN 99* 100*  CREATININE 5.51* 5.82*  CALCIUM 8.6* 8.6*   No results for input(s): PTH in the last 72 hours. Iron Studies: No results for input(s): IRON, TIBC, TRANSFERRIN, FERRITIN in the last 72 hours.  Studies/Results: No results found.  I have reviewed the patient's current medications.  Assessment/Plan: 1 CKD 4-5 with some AKI due to malig HTN.  Cr still rising slowly,.. Ideally would plateau prior to D/C.   2 HTN refusing meds.   3 Anemia got Fe, if compliant with f/u use esa 4 HPTH needs check 5 ^phos start binder.  P binder, check pth , cont antiHTN meds, lasix, follow Cr. Counsel.    LOS: 6 days   Fayrene FearingJames Esa Raden 04/23/2018,8:43 AM

## 2018-04-23 NOTE — Progress Notes (Signed)
RN faxed most updated clinicals to [facility] Board per MD verbal order (progress note from 8/11, nephr progress note from 8/12, discharge summary from 8/12, and most recent labs).

## 2018-04-23 NOTE — Progress Notes (Signed)
[place] [facility] [place] can return to [facility] as long as MD isn't recommending dialysis. MD states he is not recommending dialysis. RN notified [facility].

## 2018-04-23 NOTE — Progress Notes (Signed)
Pt discharge instructions reviewed with pt and Darnell LevelJuan Diaz, CO. Pt verbalizes understanding and states he has no questions. RN instructed CO to give discharge instructions to charge RN at facility. CO states he will. Pt belongings with pt. Pt is not in distress. Pt's CO is driving him to [facility].

## 2018-04-23 NOTE — Discharge Summary (Signed)
Physician Discharge Summary  Brian Carey NFA:[id number] DOB: 03/25/1956 DOA: 04/16/2018  PCP: Patient, No Pcp Per  Admit date: 04/16/2018 Discharge date: 04/23/2018  Admitted From: [address] Disposition: Jail   Recommendations for Outpatient Follow-up:  1. Follow up with nephrology [facility]) in 1-2 weeks. Needs repeat metabolic panel in the next day or two to check renal function. 2. Monitor HR/BP.  Home Health: None Equipment/Devices: None Discharge Condition: Stable CODE STATUS: Full Diet recommendation: Heart healthy  Brief/Interim Summary: Brian Carey is a 62 y.o. male with a history of stage IV CKD, chronic combined CHF, HTN, HLD, DM who presented from jail with 1 month of progressive symmetric leg swelling and recently increased exertional dyspnea associated with nonproductive cough. Creatinine 4.82 (putative baseline 4.3) with BUN 84, BNP >4.5k, troponin 0.07 without pulmonary edema on CXR or hypoxia. BP elevated into severe range consistent with hypertensive urgency. He was started on high dose IV lasix and admitted with significant diuresis. Creatinine has increased and nephrology consulted for recommendations. Further work up has been largely negative. Transitioned to po diuretic 8/9 with continued brisk diuresis and slight creatinine bump which has continued. He voiced the desire to be discharged which was felt to be ill advised with rising creatinine. Ultimately on 8/12 he stopped allowing for medication administration, lab draws, or vital sign monitoring. Weight at admission was 88.9kg, weight at discharge 66.8kg, creatinine 5.82. He was discharged against medical advice on 8/12 to jail. Discussed need for follow up with nephrology who will arrange this. He needs labs soon.  Discharge Diagnoses:  Principal Problem:   Fluid overload Active Problems:   CKD (chronic kidney disease), stage IV (HCC)   Type II diabetes mellitus with renal manifestations (HCC)    Essential hypertension   GERD (gastroesophageal reflux disease)   Acute on chronic systolic CHF (congestive heart failure) (HCC)   Anasarca   Anemia in chronic kidney disease (CKD)   Thrombocytopenia (HCC)   Elevated troponin   Hypertensive urgency  Fluid overload due to acute on chronic combined CHF: Medication nonadherence and advancing kidney disease both contributing. Echo 8/6 demonstrated EF 35%-40%, mild LVH with grade 3 diastolic dysfunction and pulmonary HTN (PASP ).  - Transitioned to po lasix with still increasing BUN/Cr, decreasing frequency of dosing. Continues to have net negative balance, down overall from 195lbs to 147lbs today.  - Discussed at length with the patient that it is unsafe for him to be discharged today, pt has been able to state risks (including death) of leaving against medical advice and will not allow for any active treatments or monitoring.  - Continue to monitor I/O, weights at jail, check BMP soon. - Holding lisinopril for now  Stage IV CKD, nephrotic proteinuria: Has seen a nephrologist who mentioned HD but states he was never told he needed it. Probably due to HTN. U/S with increased echogenicity, 9.9cm bilaterally with no hydro. 24 hrs protein was 4.27g/d. Complement, ANA, Hep B and C, HIV all neg/nonreactive.  - Appreciate nephrology recommendations. Will need nephrology follow up and consideration of access placement soon. Fortunately no current urgent HD needs.  HTN with hypertensive urgency: Improving.  - Continue antihypertensives as ordered which would likely bring him to goal without overcorrection. He continues to refuse medications despite counseling from RN, pharmacy, and provider. - Hold coreg for bradycardia, holding ACE. Stopped thiazide.  Sinus bradycardia:  - Decrease beta blocker  T2DM: HbA1c 7.1%. At inpatient goal with minimal insulin requirements.  - Can restart  home glipizide alone, conservative dosing to avoid  hypoglycemia.  Hypokalemia: Mild, due to diuresis.Marland Kitchen - Improved.  Elevated troponin: With benign downward trend and no chest pain, most consistent with demand ischemia with HTN urgency, CKD. No focal wall motion abnormalities on echo. - Continue ASA, prn NTG.   Thrombocytopenia: Unknown chronicity, mild.  - Monitor CBC  Anemia of chronic kidney disease and iron deficiency: No report of bleeding - Gave IV iron.  - Hgb stable, monitor intermittently.  GERD:  - Continue PPI  Discharge Instructions Discharge Instructions    Diet - low sodium heart healthy   Complete by:  As directed      Allergies as of 04/23/2018      Reactions   Penicillins Hives   Amlodipine Other (See Comments)   Patient notes this makes him dizzy      Medication List    STOP taking these medications   bumetanide 1 MG tablet Commonly known as:  BUMEX   chlorthalidone 25 MG tablet Commonly known as:  HYGROTON   lisinopril 10 MG tablet Commonly known as:  PRINIVIL,ZESTRIL     TAKE these medications   aspirin 81 MG chewable tablet Chew 81 mg by mouth daily.   carvedilol 6.25 MG tablet Commonly known as:  COREG Take 1 tablet (6.25 mg total) by mouth 2 (two) times daily with a meal. What changed:    medication strength  how much to take  when to take this   doxazosin 2 MG tablet Commonly known as:  CARDURA Take 2 mg by mouth at bedtime.   furosemide 80 MG tablet Commonly known as:  LASIX Take 1 tablet (80 mg total) by mouth daily. Start taking on:  04/24/2018   glipiZIDE 10 MG tablet Commonly known as:  GLUCOTROL Take 10 mg by mouth 2 (two) times daily.   hydrALAZINE 50 MG tablet Commonly known as:  APRESOLINE Take 100 mg by mouth 4 (four) times daily.   pantoprazole 40 MG tablet Commonly known as:  PROTONIX Take 40 mg by mouth daily.      Follow-up Information    Arita Miss, MD. Schedule an appointment as soon as possible for a visit in 1 week(s).   Specialty:   Nephrology Contact information: [address] [place] [address] [phone]          Allergies  Allergen Reactions  . Penicillins Hives  . Amlodipine Other (See Comments)    Patient notes this makes him dizzy    Consultations:  Nephrology  Procedures/Studies: Dg Chest 2 View  Result Date: 04/16/2018 CLINICAL DATA:  Right-sided chest pain and dyspnea EXAM: CHEST - 2 VIEW COMPARISON:  04/11/2018 FINDINGS: Stable cardiomegaly with moderate aortic atherosclerosis. Clear lungs. No effusion or pneumothorax. No acute osseous abnormality. IMPRESSION: Cardiomegaly without active pulmonary disease. Aortic atherosclerosis without aneurysm. Electronically Signed   By: Tollie Eth M.D.   On: 04/16/2018 22:23   US Renal  Result Date: 04/18/2018 CLINICAL DATA:  Chronic kidney disease.  Diabetes. EXAM: RENAL / URINARY TRACT ULTRASOUND COMPLETE COMPARISON:  None. FINDINGS: Right Kidney: Length: 9.9 cm.  Diffusely echogenic.  No mass or hydronephrosis. Left Kidney: Length: 9.9 cm. Diffusely echogenic. 1.6 cm simple appearing upper pole cyst. No mass or hydronephrosis. Bladder: Appears normal for degree of bladder distention. Other: Enlarged prostate gland.  Bilateral pleural effusions. IMPRESSION: 1. Diffusely echogenic kidneys compatible with medical renal disease. 2. Bilateral pleural effusions. 3. Moderate prostatic hypertrophy. Electronically Signed   By: Zada Finders.D.  On: 04/18/2018 18:16    Echocardiogram 04/17/2018: - Left ventricle: The cavity size was normal. Wall thickness was increased in a pattern of mild LVH. Systolic function was moderately reduced. The estimated ejection fraction was in the range of 35% to 40%. Diffuse hypokinesis. Doppler parameters are consistent with a reversible restrictive pattern, indicative of decreased left ventricular diastolic compliance and/or increased left atrial pressure (grade 3 diastolic dysfunction). - Ventricular septum:  Septal motion showed abnormal function and dyssynergy. - Aortic valve: Trileaflet; moderately thickened, moderately calcified leaflets. - Mitral valve: There was mild regurgitation. - Left atrium: The atrium was moderately dilated. - Right ventricle: The cavity size was mildly dilated. Wall thickness was normal. - Tricuspid valve: There was moderate regurgitation. - Pulmonary arteries: PA peak pressure: 63 mm Hg (S).  Subjective: Wants to leave. Not letting RN take vital signs, not taking any medications. States "I know I could die, I just want to leave." No swelling, dyspnea, chest pain, making plenty of urine.  Discharge Exam: Vitals:   04/22/18 1927 04/23/18 0425  BP: (!) 185/83 (!) 177/85  Pulse: 63 (!) 58  Resp: 18 18  Temp: 98.2 F (36.8 C) 98.5 F (36.9 C)  SpO2: 100% 97%   General: Pt is alert, awake, not in acute distress Cardiovascular: RRR, S1/S2 +, no rubs, no gallops Respiratory: CTA bilaterally, no wheezing, no rhonchi Abdominal: Soft, NT, ND, bowel sounds + Extremities: No edema, no cyanosis Neuro: Alert, oriented, is capable of making medical decisions (reasoning intact).  Labs: BNP (last 3 results) Recent Labs    04/16/18 2156  BNP >4,500.0*   Basic Metabolic Panel: Recent Labs  Lab 04/18/18 0503 04/18/18 1523 04/19/18 0432 04/20/18 0551 04/21/18 0420 04/22/18 0511  NA 143  --  142 143 141 141  K 3.6  --  3.4* 3.5 3.4* 3.7  CL 109  --  106 105 104 103  CO2 20*  --  23 24 26 23   GLUCOSE 93  --  74 94 104* 97  BUN 90*  --  91* 93* 99* 100*  CREATININE 5.11*  --  4.72* 5.48* 5.51* 5.82*  CALCIUM 8.6*  --  8.7* 8.9 8.6* 8.6*  PHOS  --  5.0*  --   --  4.6 5.7*   Liver Function Tests: Recent Labs  Lab 04/21/18 0420 04/22/18 0511  ALBUMIN 2.5* 2.6*   No results for input(s): LIPASE, AMYLASE in the last 168 hours. No results for input(s): AMMONIA in the last 168 hours. CBC: Recent Labs  Lab 04/16/18 2156 04/18/18 0503  04/19/18 0432 04/20/18 0551 04/22/18 0511  WBC 4.2 3.4* 3.5* 3.5* 4.0  HGB 8.3* 7.8* 7.9* 8.1* 8.7*  HCT 26.8* 25.1* 25.2* 25.6* 28.3*  MCV 78.4 75.4* 75.9* 75.1* 76.5*  PLT 129* 141* 144* 152 192   Cardiac Enzymes: Recent Labs  Lab 04/17/18 0538 04/17/18 1003  TROPONINI 0.11* 0.10*   BNP: Invalid input(s): POCBNP CBG: Recent Labs  Lab 04/21/18 1613 04/21/18 2125 04/22/18 1132 04/22/18 2110 04/23/18 0758  GLUCAP 166* 118* 197* 192* 110*   D-Dimer No results for input(s): DDIMER in the last 72 hours. Hgb A1c No results for input(s): HGBA1C in the last 72 hours. Lipid Profile No results for input(s): CHOL, HDL, LDLCALC, TRIG, CHOLHDL, LDLDIRECT in the last 72 hours. Thyroid function studies No results for input(s): TSH, T4TOTAL, T3FREE, THYROIDAB in the last 72 hours.  Invalid input(s): FREET3 Anemia work up No results for input(s): VITAMINB12, FOLATE, FERRITIN, TIBC, IRON, RETICCTPCT in  the last 72 hours. Urinalysis    Component Value Date/Time   COLORURINE STRAW (A) 04/18/2018 1554   APPEARANCEUR CLEAR 04/18/2018 1554   LABSPEC 1.006 04/18/2018 1554   PHURINE 5.0 04/18/2018 1554   GLUCOSEU 50 (A) 04/18/2018 1554   HGBUR SMALL (A) 04/18/2018 1554   BILIRUBINUR NEGATIVE 04/18/2018 1554   KETONESUR NEGATIVE 04/18/2018 1554   PROTEINUR 100 (A) 04/18/2018 1554   NITRITE NEGATIVE 04/18/2018 1554   LEUKOCYTESUR NEGATIVE 04/18/2018 1554    Microbiology Recent Results (from the past 240 hour(s))  MRSA PCR Screening     Status: None   Collection Time: 04/17/18  5:03 PM  Result Value Ref Range Status   MRSA by PCR NEGATIVE NEGATIVE Final    Comment:        The GeneXpert MRSA Assay (FDA approved for NASAL specimens only), is one component of a comprehensive MRSA colonization surveillance program. It is not intended to diagnose MRSA infection nor to guide or monitor treatment for MRSA infections. Performed at [facility], [address]  [place], [place] [address]     Time coordinating discharge: Approximately 40 minutes  Tyrone Nineyan B Raechell Singleton, MD  [facility] 04/23/2018, 10:35 AM Pager [phone]

## 2018-04-26 LAB — PTH-RELATED PEPTIDE: PTH-related peptide: <2 pmol/L

## 2018-05-22 ENCOUNTER — Other Ambulatory Visit: Payer: Self-pay

## 2018-05-22 DIAGNOSIS — N184 Chronic kidney disease, stage 4 (severe): Secondary | ICD-10-CM

## 2018-07-13 ENCOUNTER — Encounter: Admitting: Vascular Surgery

## 2018-07-13 ENCOUNTER — Other Ambulatory Visit (HOSPITAL_COMMUNITY): Payer: Self-pay

## 2018-07-13 ENCOUNTER — Encounter (HOSPITAL_COMMUNITY): Payer: Self-pay

## 2018-08-31 ENCOUNTER — Other Ambulatory Visit (HOSPITAL_COMMUNITY): Payer: Self-pay

## 2018-08-31 ENCOUNTER — Encounter (HOSPITAL_COMMUNITY): Payer: Self-pay

## 2018-08-31 ENCOUNTER — Encounter: Payer: Self-pay | Admitting: Vascular Surgery

## 2018-09-04 ENCOUNTER — Encounter (HOSPITAL_COMMUNITY): Payer: Self-pay

## 2018-09-04 ENCOUNTER — Other Ambulatory Visit (HOSPITAL_COMMUNITY): Payer: Self-pay

## 2018-09-04 ENCOUNTER — Encounter: Payer: Self-pay | Admitting: Vascular Surgery

## 2018-10-20 IMAGING — DX DG CHEST 2V
2 series · 2 of 2 positions shown · non-contrast
Comparison: 04/11/2018

CLINICAL DATA: Right-sided chest pain and dyspnea

EXAM:
CHEST - 2 VIEW

[chest pa]
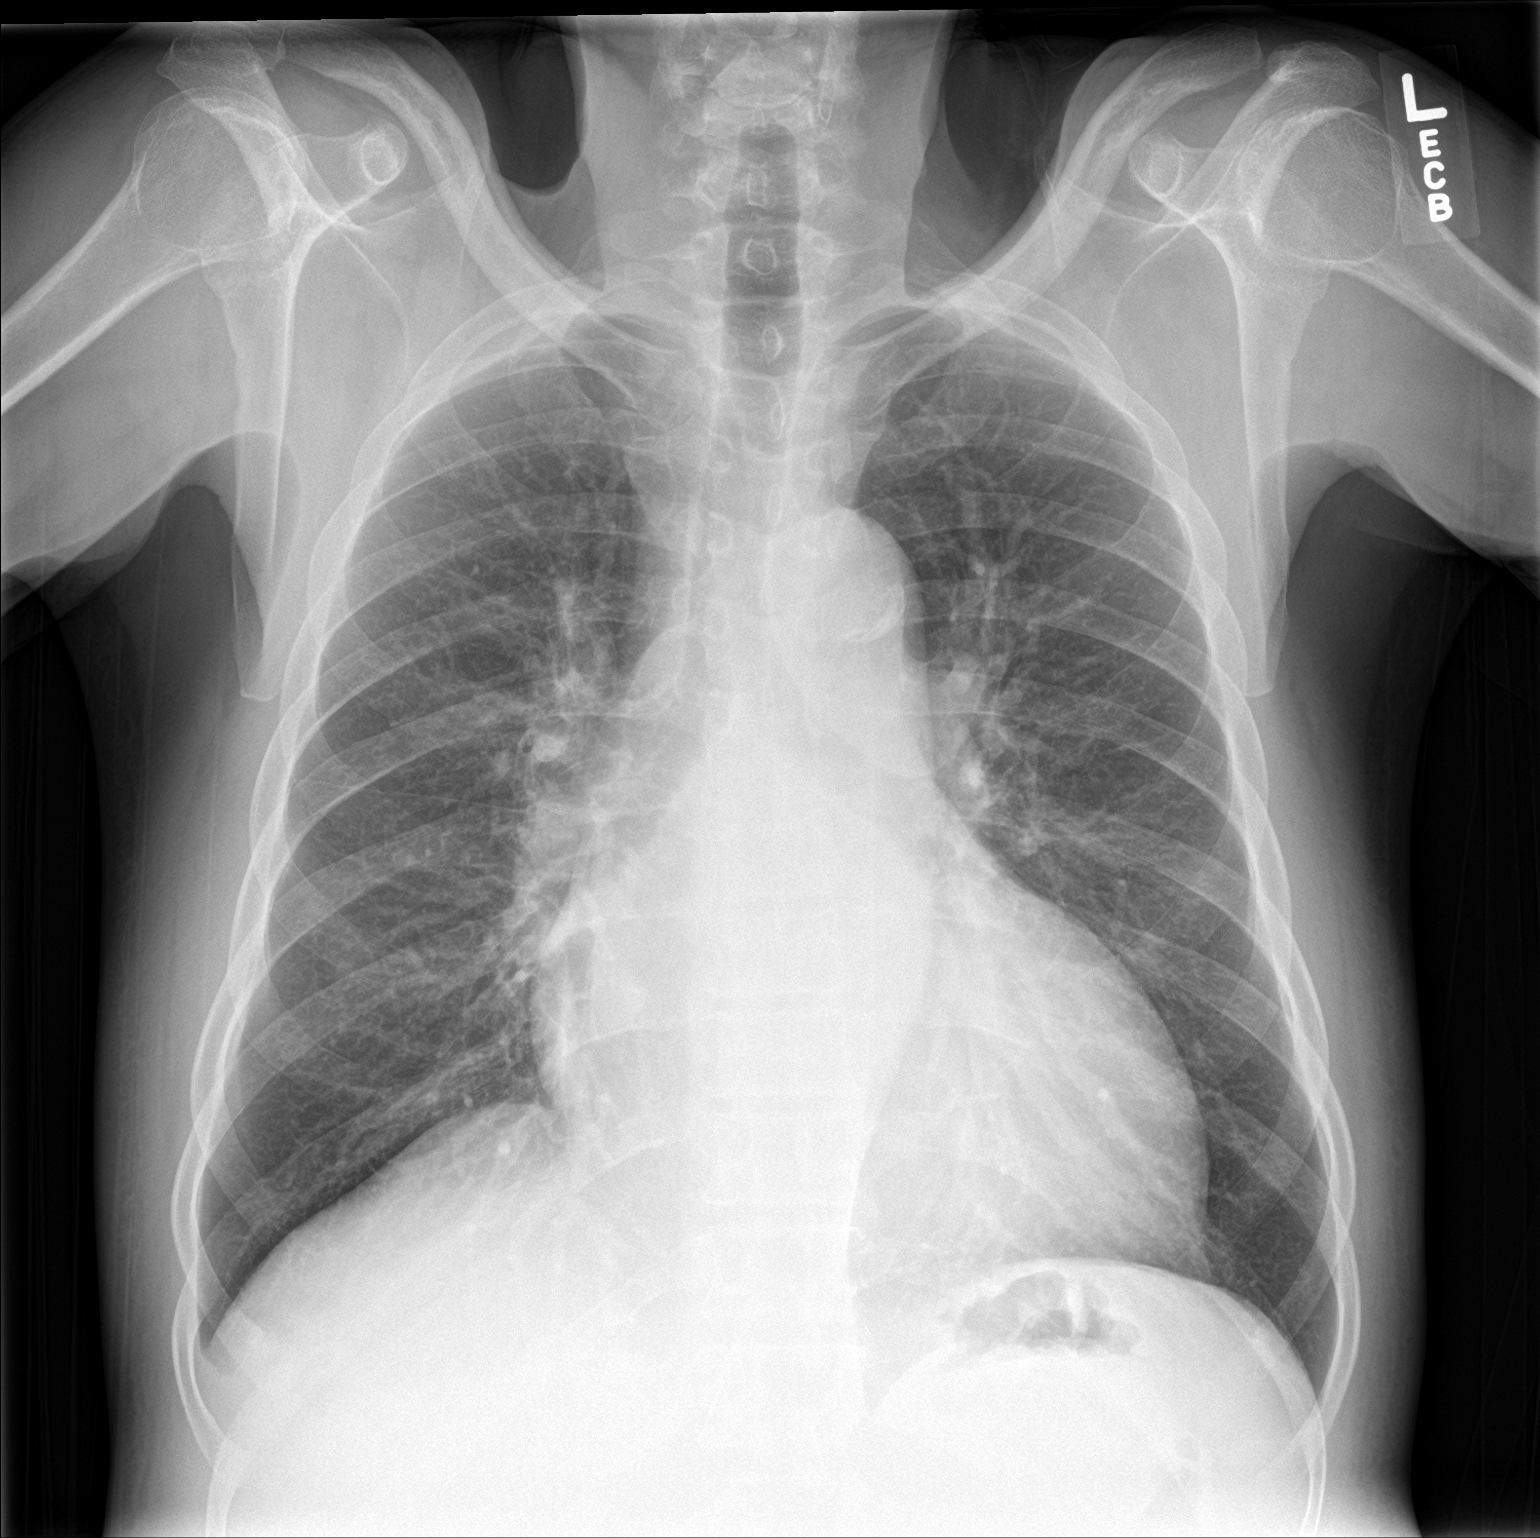

[chest lat]
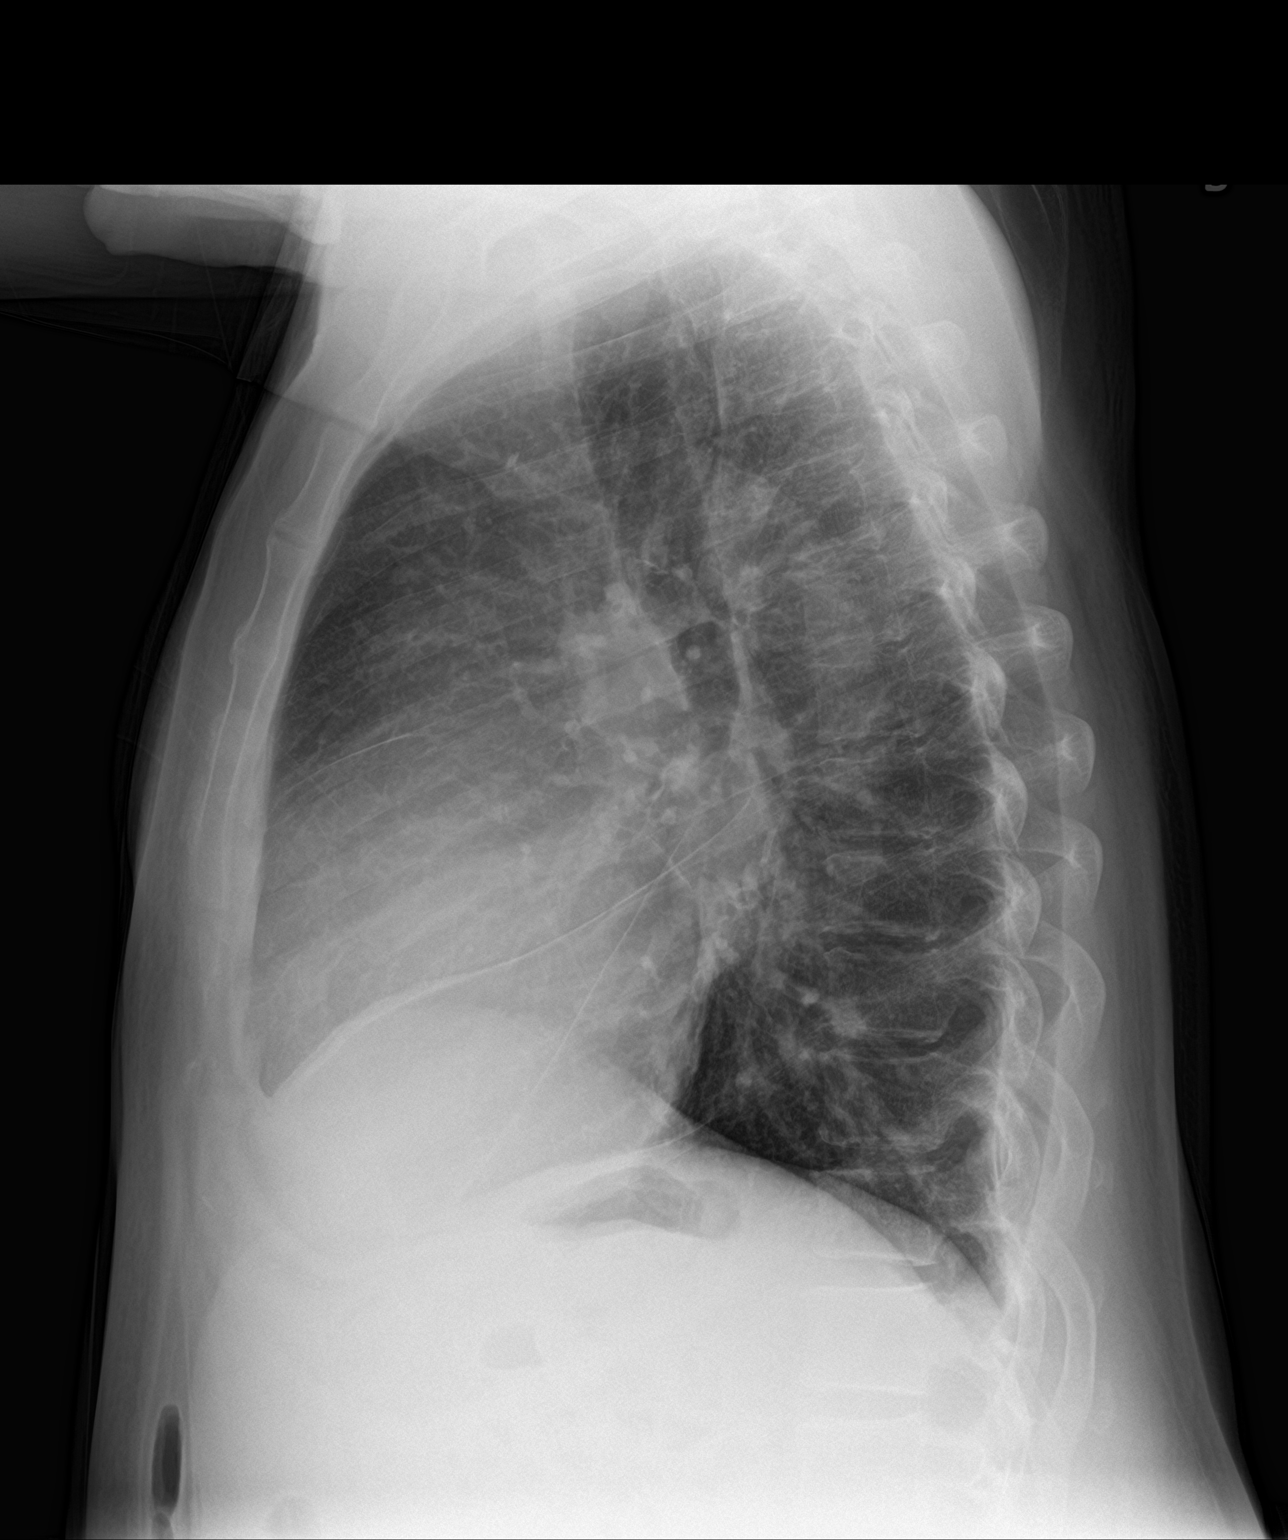

[2 of 2 positions shown; findings below may reference images not displayed]

FINDINGS: Stable cardiomegaly with moderate aortic atherosclerosis. Clear
lungs. No effusion or pneumothorax. No acute osseous abnormality.
IMPRESSION: Cardiomegaly without active pulmonary disease. Aortic
atherosclerosis without aneurysm.

## 2019-07-24 IMAGING — US US RENAL
1 series · 14 of 25 positions shown · non-contrast
Comparison: None.

CLINICAL DATA: Chronic kidney disease.  Diabetes.

EXAM:
RENAL / URINARY TRACT ULTRASOUND COMPLETE

[Series 1: us renal · 0.25mm/px · 14 of 40 slices shown]
[im 1/40]
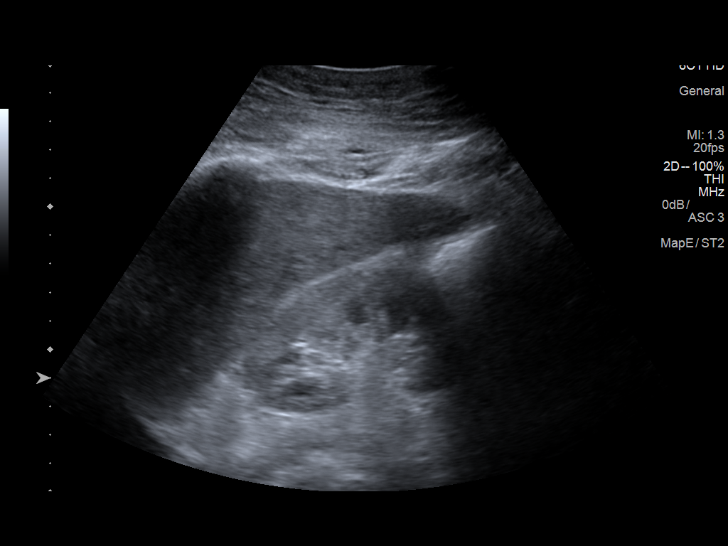
[im 4/40]
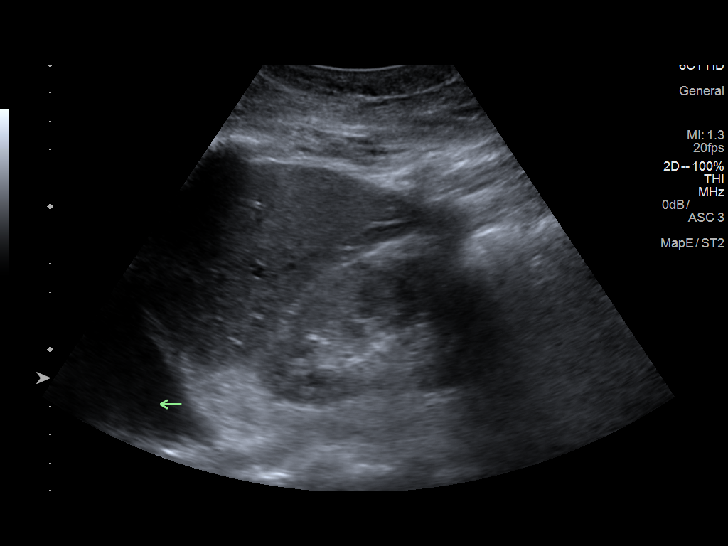
[im 7/40]
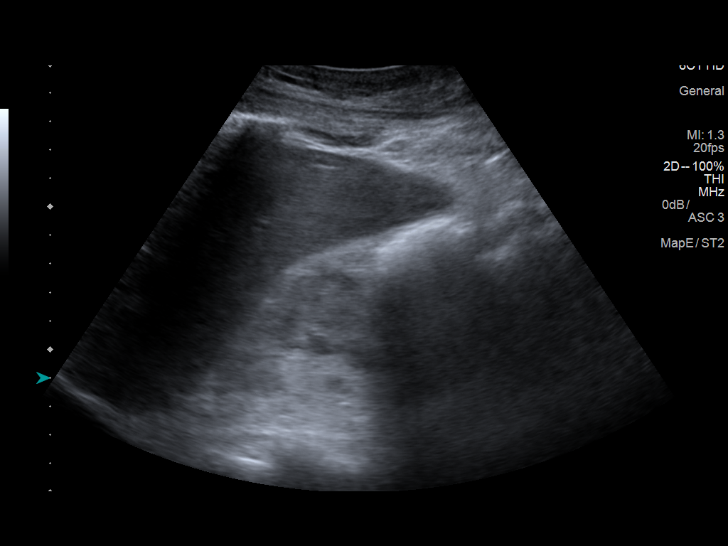
[im 10/40]
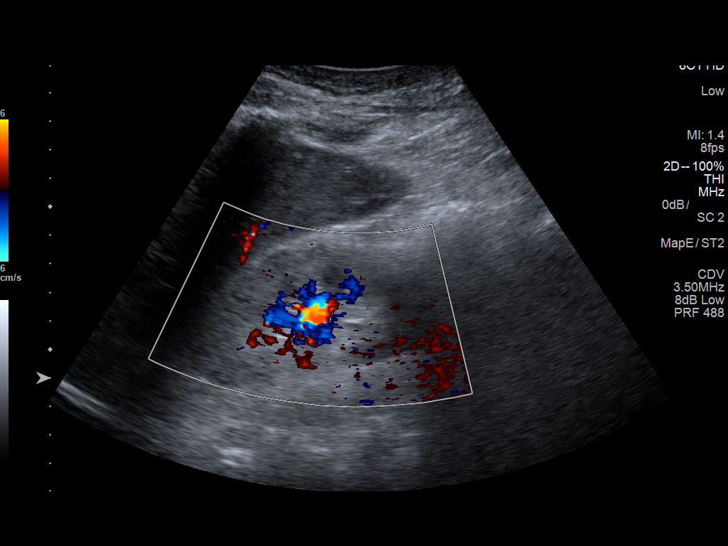
[im 14/40]
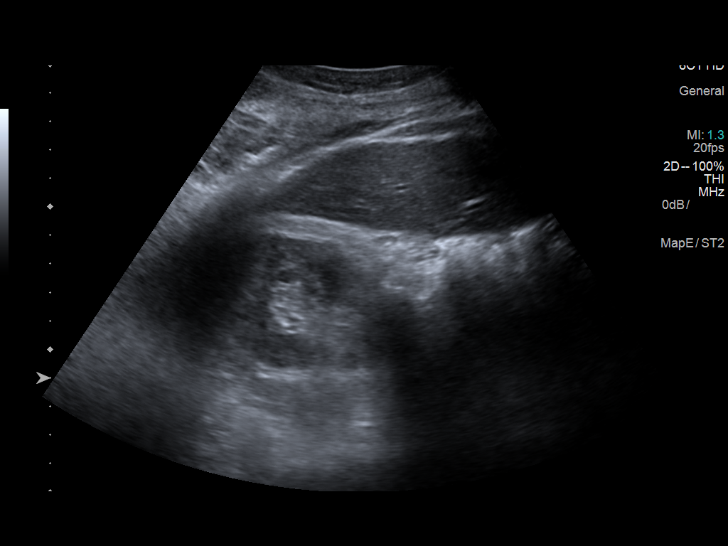
[im 15/40]
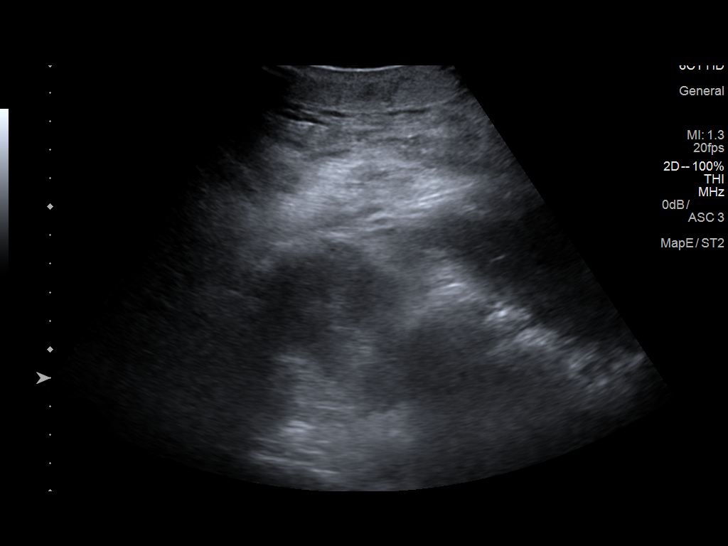
[im 18/40]
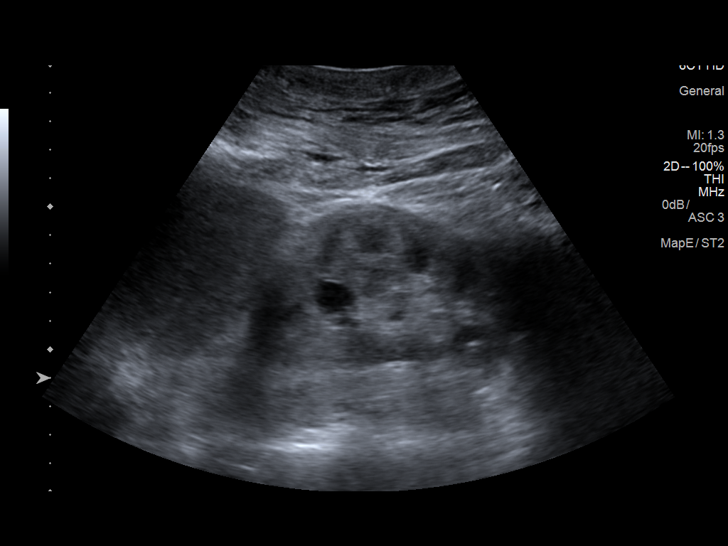
[im 22/40]
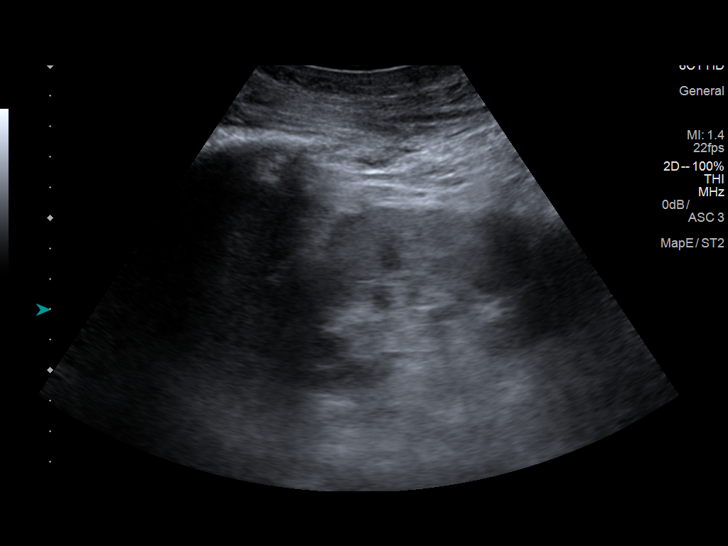
[im 25/40]
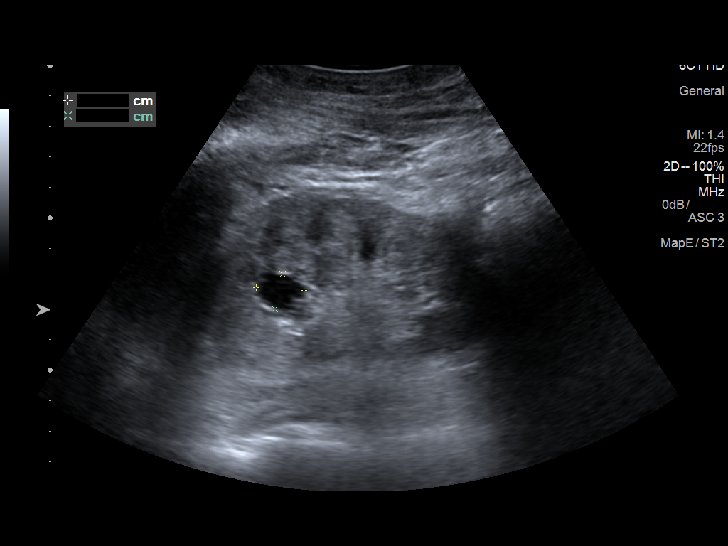
[im 27/40]
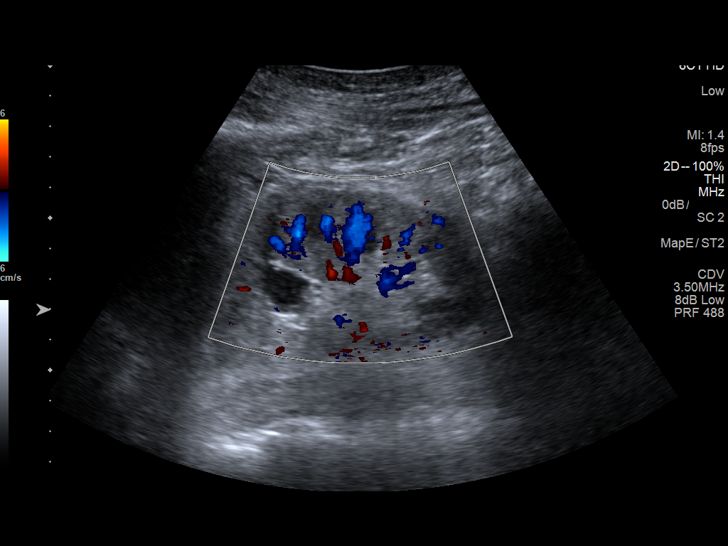
[im 30/40]
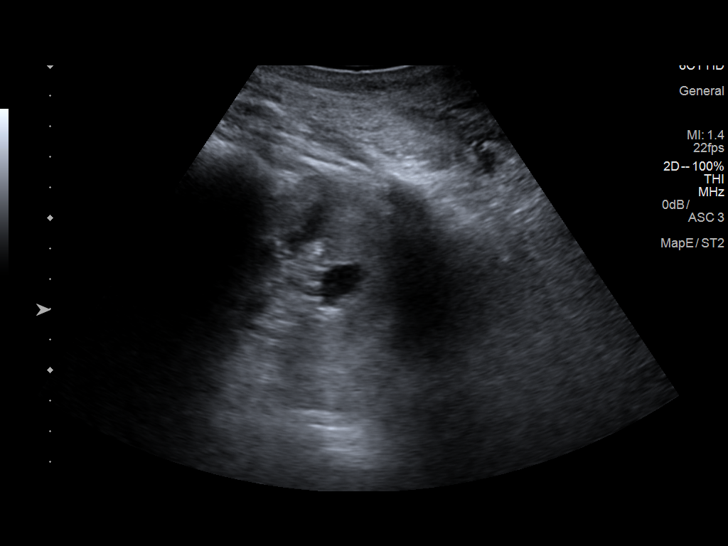
[im 33/40]
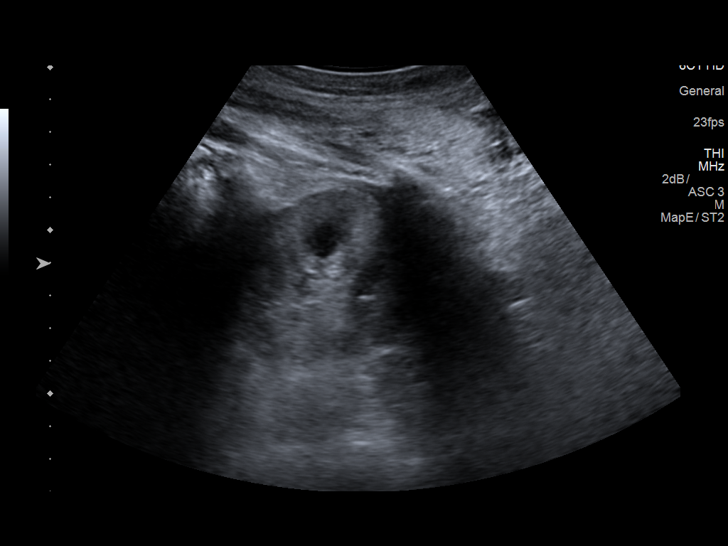
[im 36/40]
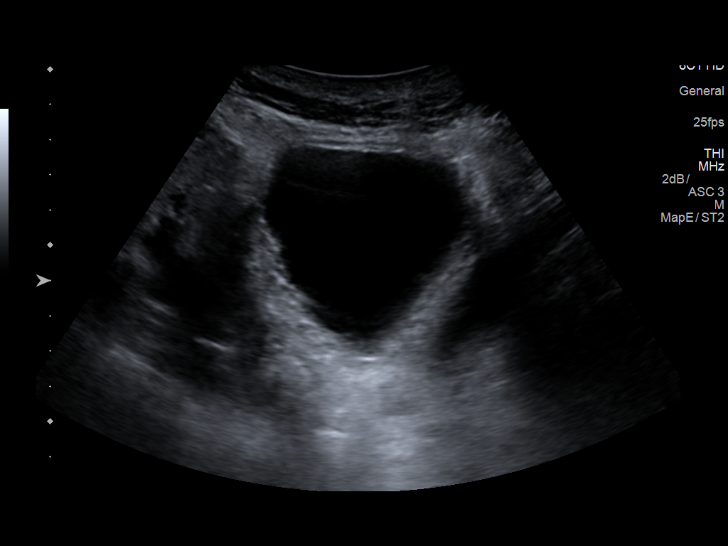
[im 40/40]
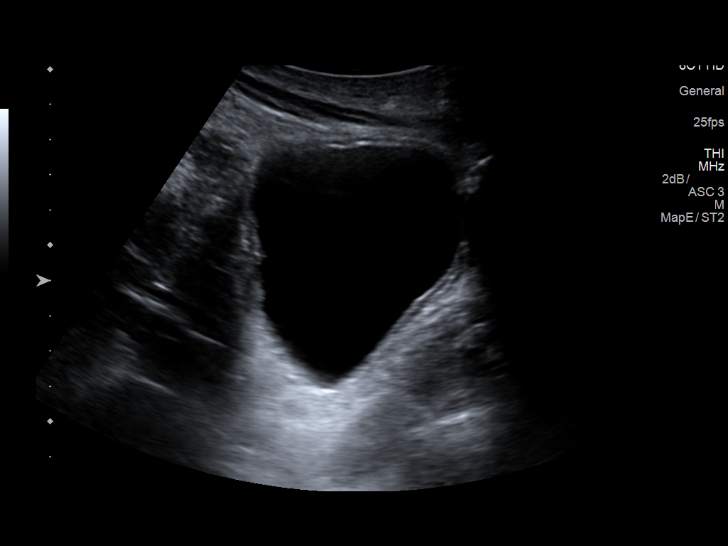

[14 of 25 positions shown; findings below may reference images not displayed]

FINDINGS: Right Kidney:

Length: 9.9 cm.  Diffusely echogenic.  No mass or hydronephrosis.

Left Kidney:

Length: 9.9 cm. Diffusely echogenic. 1.6 cm simple appearing upper
pole cyst. No mass or hydronephrosis.

Bladder:

Appears normal for degree of bladder distention.

Other: Enlarged prostate gland.  Bilateral pleural effusions.
IMPRESSION: 1. Diffusely echogenic kidneys compatible with medical renal
disease.
2. Bilateral pleural effusions.
3. Moderate prostatic hypertrophy.

## 2020-04-12 DEATH — deceased
